# Patient Record
Sex: Male | Born: 1950 | Race: White | Hispanic: No | Marital: Married | Smoking: Never smoker
Health system: Southern US, Community
[De-identification: ages and names within clinical notes are randomized; demographics above are authoritative.]

## PROBLEM LIST (undated history)

## (undated) DIAGNOSIS — M1990 Unspecified osteoarthritis, unspecified site: Secondary | ICD-10-CM

## (undated) DIAGNOSIS — Z460 Encounter for fitting and adjustment of spectacles and contact lenses: Secondary | ICD-10-CM

## (undated) DIAGNOSIS — K5732 Diverticulitis of large intestine without perforation or abscess without bleeding: Secondary | ICD-10-CM

## (undated) DIAGNOSIS — F32A Depression, unspecified: Secondary | ICD-10-CM

## (undated) DIAGNOSIS — Z974 Presence of external hearing-aid: Secondary | ICD-10-CM

## (undated) DIAGNOSIS — C801 Malignant (primary) neoplasm, unspecified: Secondary | ICD-10-CM

## (undated) DIAGNOSIS — G4733 Obstructive sleep apnea (adult) (pediatric): Secondary | ICD-10-CM

## (undated) DIAGNOSIS — D126 Benign neoplasm of colon, unspecified: Secondary | ICD-10-CM

## (undated) DIAGNOSIS — E785 Hyperlipidemia, unspecified: Secondary | ICD-10-CM

## (undated) DIAGNOSIS — Z87442 Personal history of urinary calculi: Secondary | ICD-10-CM

## (undated) DIAGNOSIS — F329 Major depressive disorder, single episode, unspecified: Secondary | ICD-10-CM

## (undated) DIAGNOSIS — F419 Anxiety disorder, unspecified: Secondary | ICD-10-CM

## (undated) DIAGNOSIS — I499 Cardiac arrhythmia, unspecified: Secondary | ICD-10-CM

## (undated) HISTORY — DX: Obstructive sleep apnea (adult) (pediatric): G47.33

## (undated) HISTORY — DX: Diverticulitis of large intestine without perforation or abscess without bleeding: K57.32

## (undated) HISTORY — DX: Benign neoplasm of colon, unspecified: D12.6

## (undated) HISTORY — DX: Hyperlipidemia, unspecified: E78.5

## (undated) HISTORY — PX: COLONOSCOPY: SHX174

## (undated) HISTORY — DX: Depression, unspecified: F32.A

## (undated) HISTORY — DX: Major depressive disorder, single episode, unspecified: F32.9

## (undated) HISTORY — PX: LUMBAR LAMINECTOMY: SHX95

---

## 1986-02-02 DIAGNOSIS — M5030 Other cervical disc degeneration, unspecified cervical region: Secondary | ICD-10-CM | POA: Insufficient documentation

## 1990-01-04 HISTORY — PX: CERVICAL LAMINECTOMY: SHX94

## 1998-05-27 ENCOUNTER — Encounter: Payer: Self-pay | Admitting: Emergency Medicine

## 1998-05-27 ENCOUNTER — Emergency Department (HOSPITAL_COMMUNITY): Admission: EM | Admit: 1998-05-27 | Discharge: 1998-05-27 | Payer: Self-pay | Admitting: Emergency Medicine

## 1998-05-28 ENCOUNTER — Ambulatory Visit (HOSPITAL_COMMUNITY): Admission: RE | Admit: 1998-05-28 | Discharge: 1998-05-28 | Payer: Self-pay | Admitting: Cardiology

## 1998-05-28 ENCOUNTER — Encounter: Payer: Self-pay | Admitting: Cardiology

## 2002-05-22 ENCOUNTER — Encounter: Payer: Self-pay | Admitting: Neurosurgery

## 2002-05-22 ENCOUNTER — Ambulatory Visit (HOSPITAL_COMMUNITY): Admission: RE | Admit: 2002-05-22 | Discharge: 2002-05-22 | Payer: Self-pay

## 2003-11-10 ENCOUNTER — Encounter: Admission: RE | Admit: 2003-11-10 | Discharge: 2003-11-10 | Payer: Self-pay | Admitting: Orthopedic Surgery

## 2005-04-29 ENCOUNTER — Ambulatory Visit: Payer: Self-pay | Admitting: Gastroenterology

## 2005-05-07 ENCOUNTER — Ambulatory Visit: Payer: Self-pay | Admitting: Gastroenterology

## 2005-05-07 ENCOUNTER — Encounter (INDEPENDENT_AMBULATORY_CARE_PROVIDER_SITE_OTHER): Payer: Self-pay | Admitting: Specialist

## 2005-09-01 ENCOUNTER — Encounter: Admission: RE | Admit: 2005-09-01 | Discharge: 2005-09-01 | Payer: Self-pay | Admitting: Neurosurgery

## 2005-09-03 ENCOUNTER — Encounter: Admission: RE | Admit: 2005-09-03 | Discharge: 2005-09-03 | Payer: Self-pay | Admitting: Neurosurgery

## 2005-11-08 ENCOUNTER — Encounter: Admission: RE | Admit: 2005-11-08 | Discharge: 2005-11-08 | Payer: Self-pay | Admitting: Radiology

## 2006-01-04 HISTORY — PX: SHOULDER ARTHROSCOPY: SHX128

## 2006-01-04 HISTORY — PX: ROTATOR CUFF REPAIR: SHX139

## 2006-03-30 ENCOUNTER — Encounter: Admission: RE | Admit: 2006-03-30 | Discharge: 2006-03-30 | Payer: Self-pay | Admitting: Orthopedic Surgery

## 2006-04-14 ENCOUNTER — Ambulatory Visit (HOSPITAL_BASED_OUTPATIENT_CLINIC_OR_DEPARTMENT_OTHER): Admission: RE | Admit: 2006-04-14 | Discharge: 2006-04-15 | Payer: Self-pay | Admitting: Orthopedic Surgery

## 2006-04-14 ENCOUNTER — Encounter (INDEPENDENT_AMBULATORY_CARE_PROVIDER_SITE_OTHER): Payer: Self-pay | Admitting: Specialist

## 2006-05-19 ENCOUNTER — Encounter: Admission: RE | Admit: 2006-05-19 | Discharge: 2006-05-19 | Payer: Self-pay | Admitting: Orthopedic Surgery

## 2006-06-20 ENCOUNTER — Encounter: Admission: RE | Admit: 2006-06-20 | Discharge: 2006-06-20 | Payer: Self-pay | Admitting: Diagnostic Radiology

## 2006-12-08 ENCOUNTER — Ambulatory Visit (HOSPITAL_BASED_OUTPATIENT_CLINIC_OR_DEPARTMENT_OTHER): Admission: RE | Admit: 2006-12-08 | Discharge: 2006-12-08 | Payer: Self-pay | Admitting: Orthopedic Surgery

## 2007-08-27 IMAGING — CR DG SHOULDER 2+V*L*
3 series · 3 of 3 positions shown · non-contrast
Comparison: none

CLINICAL DATA: Left shoulder pain.
LEFT SHOULDER ? 3 VIEW:

[view not recorded (1 of 3)]
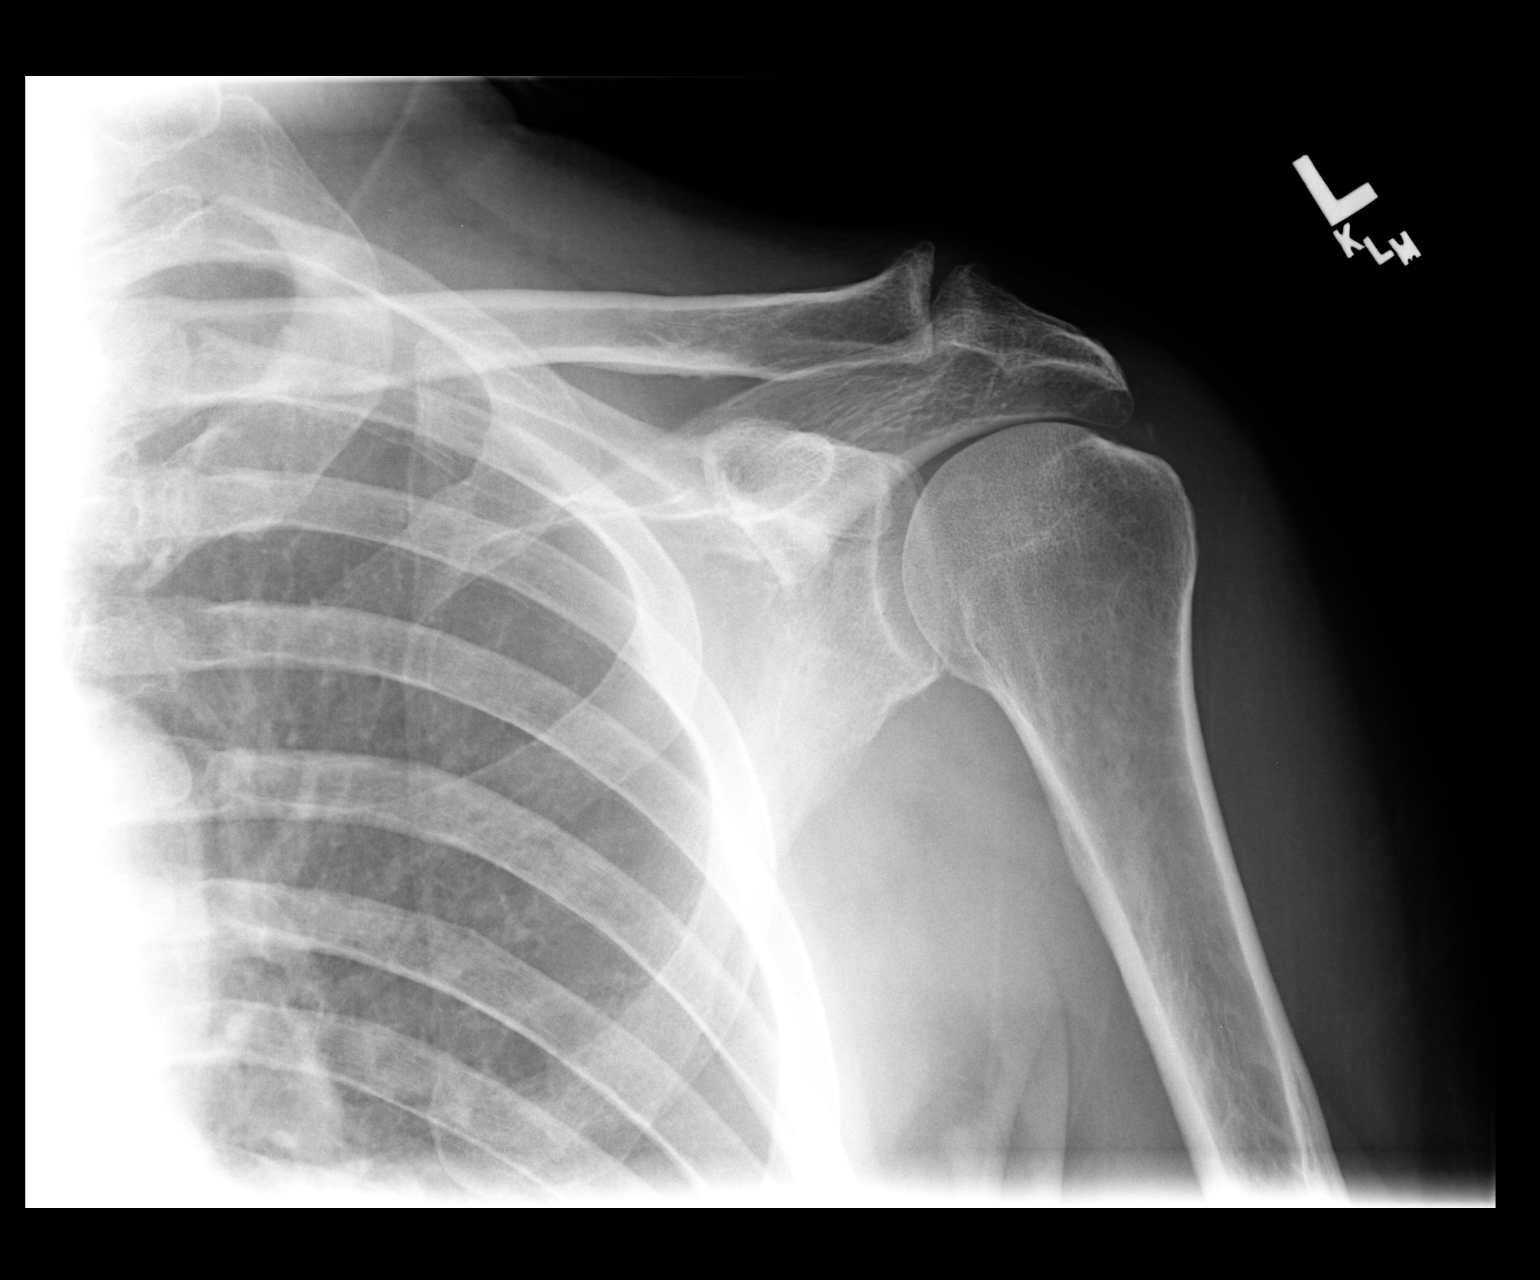

[view not recorded (2 of 3)]
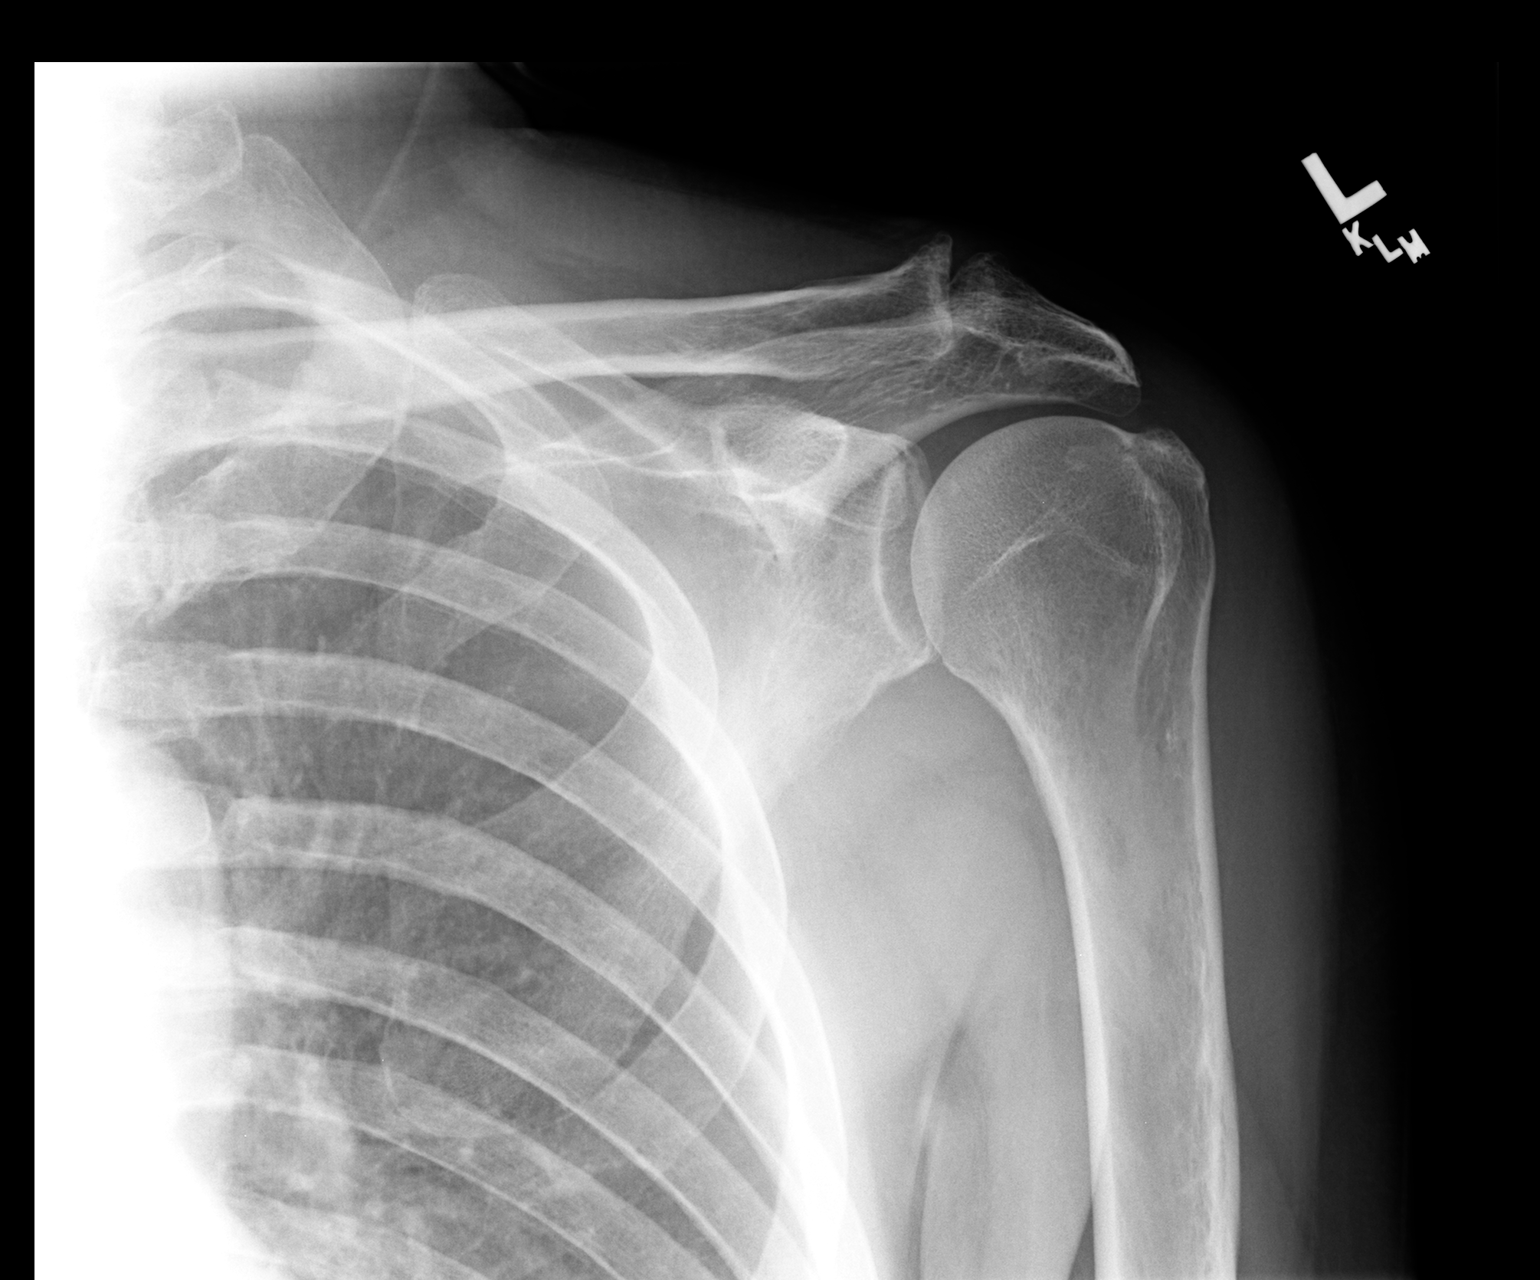

[view not recorded (3 of 3)]
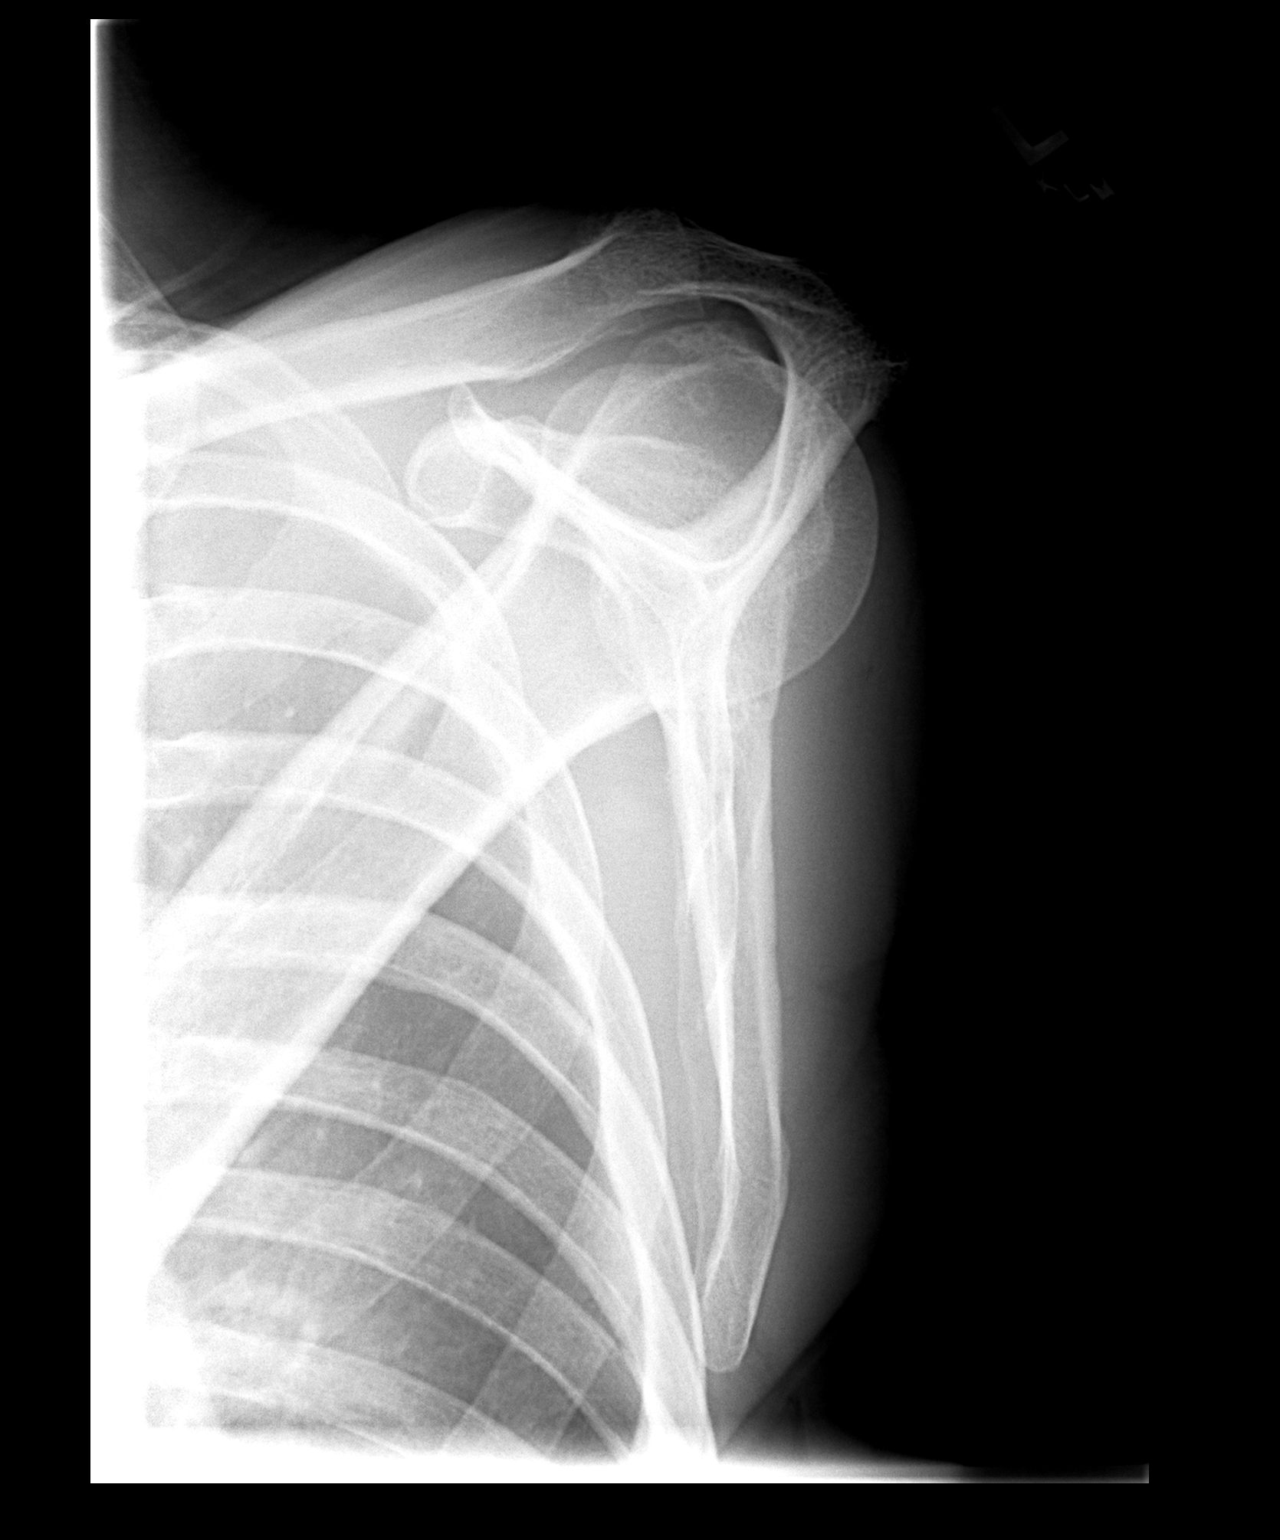

[3 of 3 positions shown; findings below may reference images not displayed]

FINDINGS: There is no evidence of acute fracture, subluxation, or dislocation.  Moderate acromioclavicular joint overgrowth is noted.  A tiny calcification in the region of the distal rotator cuff is present.
IMPRESSION: 1.  Moderate acromioclavicular degenerative changes. 
2.  Tiny density along the distal rotator cuff may represent rotator cuff calcification.

## 2007-09-05 ENCOUNTER — Encounter: Admission: RE | Admit: 2007-09-05 | Discharge: 2007-09-05 | Payer: Self-pay | Admitting: Orthopedic Surgery

## 2007-10-26 ENCOUNTER — Encounter (INDEPENDENT_AMBULATORY_CARE_PROVIDER_SITE_OTHER): Payer: Self-pay | Admitting: Orthopedic Surgery

## 2007-10-26 ENCOUNTER — Ambulatory Visit (HOSPITAL_BASED_OUTPATIENT_CLINIC_OR_DEPARTMENT_OTHER): Admission: RE | Admit: 2007-10-26 | Discharge: 2007-10-27 | Payer: Self-pay | Admitting: Orthopedic Surgery

## 2009-12-25 ENCOUNTER — Encounter
Admission: RE | Admit: 2009-12-25 | Discharge: 2009-12-25 | Payer: Self-pay | Source: Home / Self Care | Attending: Orthopaedic Surgery | Admitting: Orthopaedic Surgery

## 2010-01-04 HISTORY — PX: KNEE ARTHROSCOPY: SUR90

## 2010-01-15 ENCOUNTER — Ambulatory Visit
Admission: RE | Admit: 2010-01-15 | Discharge: 2010-01-15 | Payer: Self-pay | Source: Home / Self Care | Attending: Orthopaedic Surgery | Admitting: Orthopaedic Surgery

## 2010-01-19 LAB — POCT HEMOGLOBIN-HEMACUE: Hemoglobin: 15.8 g/dL (ref 13.0–17.0)

## 2010-01-25 ENCOUNTER — Encounter: Payer: Self-pay | Admitting: Orthopedic Surgery

## 2010-01-29 NOTE — Op Note (Addendum)
  NAMEISSAIH, KAUS NO.:  [id number]  MEDICAL RECORD NO.:  [id number]          PATIENT TYPE:  AMB  LOCATION:  DSC                          FACILITY:  [facility]  PHYSICIAN:  Lubertha Basque. Dalldorf, M.D.DATE OF BIRTH:  1950/05/31  DATE OF PROCEDURE:  01/15/2010 DATE OF DISCHARGE:                              OPERATIVE REPORT   PREOPERATIVE DIAGNOSIS:  Right knee torn medial meniscus.  POSTOPERATIVE DIAGNOSIS:  Right knee torn medial meniscus.  PROCEDURE:  Right knee partial medial meniscectomy.  ANESTHESIA:  Block and MAC.  ATTENDING SURGEON:  Lubertha Basque. Jerl Santos, MD  ASSISTANT:  Lindwood Qua, PA   INDICATIONS FOR PROCEDURE:  The patient is a 60 year old man with a several-month history of a locking painful knee.  This has persisted despite oral anti-inflammatories and bracing and rest.  By MRI scan, he has displaced meniscal tear.  He is offered an arthroscopy.  Informed operative consent was obtained after discussion of possible complications including reaction to anesthesia and infection.  SUMMARY OF FINDINGS AND PROCEDURE:  Under knee block and light MAC, a right knee arthroscopy was performed.  He did have a displaced meniscal tear with a large fragment in the notch consistent with his MRI.  A partial medial meniscectomy was done performing about 20% resection of this structure back to stable tissues.  He had really of paucity of degenerative changes in any compartment.  The lateral meniscus and ACL were intact.  DESCRIPTION OF THE PROCEDURE:  The patient was taken to the operating suite where knee block and MAC were applied.  He was positioned supine and prepped and draped in normal sterile fashion.  After administration of IV Kefzol, an arthroscopy of the right knee was performed through a total of 2 portals.  Findings were as noted above and procedure consisted of the partial medial meniscectomy done with basket and shaver back to stable tissues.   There were no degenerative changes to speak of. The patient watched the entire case.  The knee was thoroughly irrigated at the end of the case followed by placement of Adaptic over the portals, dry gauze, and loose Ace wrap.  Estimated blood loss and intraoperative fluids obtained from anesthesia records.  DISPOSITION:  The patient was taken to recovery room in stable condition.  He was to go home same day and follow up with me in my office next week.  I will contact him by phone tonight.     Lubertha Basque Jerl Santos, M.D.     PGD/MEDQ  D:  01/15/2010  T:  01/15/2010  Job:  [id number]  Electronically Signed by Marcene Corning M.D. on 01/29/2010 03:18:00 PM

## 2010-03-19 ENCOUNTER — Other Ambulatory Visit: Payer: Self-pay | Admitting: Dermatology

## 2010-04-15 ENCOUNTER — Other Ambulatory Visit: Payer: Self-pay | Admitting: Family Medicine

## 2010-04-16 ENCOUNTER — Ambulatory Visit
Admission: RE | Admit: 2010-04-16 | Discharge: 2010-04-16 | Disposition: A | Discharge status: Home / Self Care | Payer: PRIVATE HEALTH INSURANCE | Source: Ambulatory Visit | Attending: Family Medicine | Admitting: Family Medicine

## 2010-04-16 MED ORDER — IOHEXOL 300 MG/ML  SOLN: 100.0000 mL | Freq: Once | INTRAMUSCULAR | Status: AC | PRN

## 2010-05-19 NOTE — Op Note (Signed)
[name], [name]               ACCOUNT NO.:  [id number]   MEDICAL RECORD NO.:  [id number]          PATIENT TYPE:  AMB   LOCATION:  DSC                          FACILITY:  [facility]   PHYSICIAN:  Katy Fitch. Sypher, M.D. DATE OF BIRTH:  10-25-1950   DATE OF PROCEDURE:  12/08/2006  DATE OF DISCHARGE:                               OPERATIVE REPORT   PREOPERATIVE DIAGNOSIS:  Stage III impingement right shoulder with MRI  evidence of advanced AC arthropathy and unfavorable AC and acromial  anatomy with partial thickness bursal side degenerative tear of rotator  cuff.   POSTOPERATIVE DIAGNOSIS:  Stage III impingement right shoulder with  advanced AC arthropathy with meniscal debris at [facility] joint and significant  bursal side partial thickness abrasion tear of supraspinatus and  infraspinatus tendons.   OPERATION:  1. Examination of right shoulder under anesthesia.  2. Arthroscopic evaluation glenohumeral joint confirming a stable      biceps anchor and normal anterior glenohumeral ligaments and an      intact joint side surface of the rotator cuff tendon insertions.  3. Arthroscopic subacromial decompression with bursectomy,      coracoacromial ligament relaxation and acromioplasty.  4. Arthroscopic distal clavicle resection.   OPERATING SURGEON:  Katy Fitch. Sypher, M.D.   ASSISTANT:  Molly Maduro Dasnoit PA-C.   ANESTHESIA:  General endotracheal supplemented by a right interscalene  block.   SUPERVISING ANESTHESIOLOGIST:  Dr. Autumn Patty.   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  Early in  2008, we performed a reconstruction of a chronic stage III  impingement/degenerative rotator cuff tear predicament on the left.  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  Plain films of the right shoulder demonstrated very  unfavorable AC anatomy and signs  of chronic impingement.  An MRI  confirmed a partial-thickness rotator cuff tear and very unfavorable  distal clavicle and medial acromial anatomy.   After informed consent, he has elected to proceed with preemptive  arthroscopic surgery at this time anticipating debridement, subacromial  decompression, distal clavicle resection and debridement of the rotator  cuff abrasion tear.  After informed consent he is brought to the  operating room.   Preoperatively, we discussed the possibility of finding a full-thickness  rotator cuff tear that we would repair if identified.  If not, our goal  be to prevent continued stage II and III impingement and allow his  rotator cuff to repair if possible.   He understands that there is a risk that he will develop a rotator cuff  tear despite our decompression efforts due to intrinsic tendon aging and  apoptosis.   After informed consent, during which questions were invited and answered  in the office as well as holding area, he is brought to the operating  room at this time.   PROCEDURE:  [name] is brought to the operating room, placed in  supine position upon the operating table.   Dr. Sampson Goon had placed a right interscalene  block in the holding  area, leading to excellent anesthesia of the right forequarter.   He was brought to room #8, placed in supine position upon the operating  table and under Dr. Jarrett Ables direct supervision, general  endotracheal anesthesia induced.  He was carefully positioned in the  beach-chair position with the aid of a torso and head holder designed  for shoulder arthroscopy.   Examination of the right shoulder under anesthesia revealed no evidence  of instability of the glenohumeral joint in anterior, inferior and  posterior stress.  There was impingement at about 160 degrees of forward  elevation and abduction.   The right arm and forequarter were prepped with DuraPrep and draped with  impervious  arthroscopy drapes.   The procedure commenced with placement of the arthroscope through a  standard posterior portal.  Diagnostic arthroscopy revealed intact  hyaline articular cartilage surfaces on the humeral head and glenoid.  The labrum was intact.  The biceps origin was tested with a nerve hook  and found to be sound with no peel-back sign.  The deep surface of the  rotator cuff was intact including the subscapularis, supraspinatus,  infraspinatus and teres minor.  The inferior recess was normal.   The scope was removed from the glenohumeral joint after photographic  documentation of the joint condition.  The scope was then placed in the  subacromial space.  A remarkable amount of bursitis was noted which was  subsequently debrided with a suction shaver.  The [facility] joint had a very  thickened capsule.  This was taken down with the cutting cautery and  immediately a fragment of meniscus fell from the joint.  This was  documented with a digital camera.  The unfavorable AC anatomy was  documented with a camera followed by removal of the distal 18 mm of  clavicle with a suction bur brought in anteriorly.  The acromion was  leveled to a type 1 morphology with the anterior medial bone removal.  The coracoacromial ligament was relaxed with a cutting cautery and  hemostasis achieved with bipolar cautery.  The subacromial space was  debrided of hypertrophic bursa and bipolar cautery used to control  bleeding.  The conjoined tendon of the supraspinatus and infraspinatus  at the watershed area had a 40% or perhaps 50% partial thickness fray-  type tear.  There was no sign of a full-thickness tear.  This is similar  to the pathology noted on the left side, however, on the left side there  was about a 2 or more centimeter full-thickness gap created due to the  sub-AC abrasion.   The necrotic tendon was debrided.  In my judgment formal repair of the  rotator cuff is not indicated at this time.    The arthroscopic equipment was removed followed by repair of the portals  with mattress sutures of 3-0 Prolene and application of a luminous gauze  dressing with paper tape.   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  We will see him in 24 hours for dressing change, x-  ray and instruction in his initial rehabilitation program.  There were  no apparent complications.   For aftercare, he is provided prescriptions for Dilaudid 2 mg one or two  tablets p.o. q.4-6h. p.r.n. pain 40 tablets without refill.  Also,  Keflex 500 mg one p.o. q.8h. x4 days as a prophylactic antibiotic and he  has ibuprofen at his home.  Katy Fitch Sypher, M.D.  Electronically Signed     RVS/MEDQ  D:  12/08/2006  T:  12/08/2006  Job:  [id number]   cc:   Geanie Cooley, M.D.

## 2010-05-19 NOTE — Op Note (Signed)
NAMEKATRELL, MILHORN               ACCOUNT NO.:  [id number]   MEDICAL RECORD NO.:  [id number]          PATIENT TYPE:  AMB   LOCATION:  DSC                          FACILITY:  [facility]   PHYSICIAN:  Katy Fitch. Sypher, M.D. DATE OF BIRTH:  1950/05/10   DATE OF PROCEDURE:  10/26/2007  DATE OF DISCHARGE:                               OPERATIVE REPORT   PREOPERATIVE DIAGNOSES:  Complex mid substance tear, left infraspinatus  tendon, medial to insertion at greater tuberosity, left shoulder with  partial posterior tear of supraspinatus, and significant degenerative  tendinopathy and boutonniere of shoulder.   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   OPERATION:  1. Arthroscopic tenolysis of supraspinatus, infraspinatus, and teres      minor tendons with extensive release of adhesions from capsule of      acromioclavicular joint deltoid and deep surface of acromion.  2. Arthroscopic and open anterior interval slide for a severely      retracted U-shape longitudinal tear of the infraspinatus and teres      minor tendons with superior migration of the humeral head through a      boutonniere deformity created by the marked retraction of the      infraspinatus tendon.  3. Open reconstruction of left rotator cuff utilizing a fiber tape,      medial convergent suture and multiple Mason-Allen figure-of-eight      sutures brought through drill holes to bone laterally for      lateralization of supraspinatus and infraspinatus tendons.   OPERATING SURGEON:  Katy Fitch. Sypher, MD   ASSISTANT:  Marveen Reeks Dasnoit, PA-C   ANESTHESIA:  General endotracheal supplemented by left interscalene  block.   SUPERVISING ANESTHESIOLOGIST:  Janetta Hora. Gelene Mink, MD   INDICATIONS:  Allayne Gitelman. 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  He has a history of bilateral  significant shoulder impingement and a necrotic complex tear of his left  supraspinatus identified in March 2008, repaired in April 2008 with a  partial superior transfer of the subscapularis and convergence with the  infraspinatus to decorticated greater tuberosity with a double row  repair utilizing McLaughlin through bone sutures and over-the-top  sutures of the medial row to a lateral PushLock.   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   Due to his subscapularis transfer, he had some residual loss of  elevation and full external rotation and 90 degrees abduction.   Approximately 1 year following surgery, he noted a sense of slipping in  his shoulder and began to have impaired shoulder rhythm.  He was using  primarily scapulothoracic abduction rather than a combined glenohumeral  and scapulothoracic abduction of the left shoulder.   Clinical examination suggested impairment of the rotator cuff.  An MRI  of the rotator cuff was repeated  in September 2009, documenting a mid  substance rupture of the infraspinatus.   Referring back to the March 2008 MRI suggested that there was  significant native tendinosis/tendinopathy of the infraspinatus.  At  that time, however, with direct visual inspection at surgery, he  appeared to have adequate tendon substance to allow repair.   It is now apparent that he has a complex tendinopathy that will be quite  challenging to reconstruct.   With preoperative informed consent, we advised him that we would attempt  an anterior interval slide, margin convergence, and reconstruction of  his infraspinatus, teres minor, and supraspinatus to the greater  tuberosity.   We had entertained the  possible use of the tendon substitute such as  graft jacket or his native tendon from a remote location.   In general, these type of procedures have uneven outcomes; therefore, a  primary repair is deemed preferable.   Preoperatively, he was advised of the potential risks and benefits of  surgery.  He is anticipating a salvage intervention for his rotator cuff  tendinopathy.   Preoperatively, he was interviewed by Dr. Gelene Mink who recommended  proceeding with a left interscalene block.  This was placed without  complication in the holding area, followed by transfer to room 8.  Under  Dr. Thornton Dales direct supervision, general endotracheal anesthesia was  induced followed by careful positioning in the beach chair position with  aid of a torso and head holder designed for shoulder arthroscopy.   Examination of the left shoulder under anesthesia revealed combined  elevation of 160 degrees, external rotation of 65 degrees, and 90  degrees of abduction, and internal rotation of 30 degrees.  There was a  sense of posterior superior migration with pistoning of the shoulder and  what appeared to be some buttonholing and tension of the coracohumeral  ligament limiting the external rotation.   We proceeded to examine the shoulder with the arthroscope, placed in a  standard posterior viewing portal, using a switching stick from an  anterior approach, so as not to accidentally impale the rotator cuff.   Immediately upon joint entry, it was obvious that there was a complex  necrotic rupture of the entire infraspinatus and a portion of the  supraspinatus tendon that had been repaired and transferred with the  subscapularis in April 2008.  Photographic documentation of the tendon  rupture was accomplished with a digital camera.  It should be noted that  there were loops of suture that formerly anchored the infraspinatus and  supraspinatus hanging intact within the joint suggesting that the   failure of the repair had been at the suture tendon interface.   The biceps tendon was medially subluxed slightly and there was some  labral degeneration.  An anterior portal was created under direct vision  followed by labral debridement and extensive debridement of the necrotic  tendon.   Following the principles of Burkhardt, a complete tenolysis of the cuff  was accomplished with release of adhesions in the superior recess,  release of adhesions around the biceps tendon, debridement of the  supraspinatus, infraspinatus, and teres minor to a stable margin  followed by placement of the scope in the subacromial space.  Extensive  adhesions were noted between the transferred subscapularis and  supraspinatus with the acromion and the deltoid fascia.  The [facility] joint  resection site also had dense adhesions to the supraspinatus, which were  lysed with a suction shaver and electrocautery.  An arthroscopic  anterior interval slide was  initiated, however, due to the marked  adhesions and the density of the adhesions, we ultimately proceeded with  skeletization of the supraspinatus and infraspinatus on their bursal  surfaces and skeletization of the scapular spine to relieve all  adhesions.  Care was taken to identify the location of the suprascapular  nerve and not to penetrate the fat pad adjacent to the scapular spine.  We used an arthroscopic tendon grasper to try to mobilize the  supraspinatus and infraspinatus and found them to be densely adhesed  with the teres minor and infraspinatus quite adhesed well posterior.  At  this point, I elected to proceed with an open tenolysis and  reconstruction.  The arthroscopic equipment was removed followed by  reentry through Dr. Luvenia Starch prior incision from April 2008.  The scar  was resected followed by anterior deltoid split, elevation of the  anterior third of deltoid about 1 cm at the anterior lateral corner of  the acromion, and elevation of the  middle third approximately 1 cm  posteriorly.  There was a reactive bone spur that had formed at the  anterior acromion that was meticulously resected with a hand rasp and  rongeur.  An extensive bursectomy was accomplished.  There were dense  bursal adhesions between the acromion, AC joint capsule, and the  supraspinatus as well as extremely dense adhesions posteriorly with a  retracted infraspinatus and teres minor.  After extensive bursectomy,  well posterior, I was able to place a series of traction sutures from  lateral to medial in the teres minor, infraspinatus, and gradually moved  the tendon anteriorly and forward.  A pair of scissors were used to  release the rotator interval followed by release of the coracohumeral  ligament at the base of the coracoid followed by tenolysis of the  rotator cuff with a Key elevator, specifically the supraspinatus, and a  posterior shift of the supraspinatus approximately 1-1.5 cm and  lateralization of approximately another 1 cm.  With great effort, we  gradually lysed the adhesions on the infraspinatus and teres minor and  after placing a fiber tape with the Scorpion arthroscopic suture passer  well medially.  We were able to place a figure-of-eight suture creating  a margin convergence between the supraspinatus and the infraspinatus.  After further mobilization of the tendons, I was able to lateralize the  infraspinatus and supraspinatus to a near anatomic footprint followed by  placement of 2 figure-of-eight mattress sutures of #2 FiberWire placed  through drill holes in bone with a Mason-Allen technique.   About a 95% coverage of the greater tuberosity was achieved with a near  anatomic position of the infraspinatus and supraspinatus junction.   Due to the nature of the tear, a fragment of the infraspinatus on the  greater tuberosity measuring about 1.5 cm in length and 1 cm in width  was removed in preparation of the greater tuberosity and  placed in  formalin for pathologic evaluation.   I contacted Dr. Tammi Sou of Surgical Pathology, and asked him to  specifically exam the tendon looking for signs of tenocyte viability and  to offer an opinion regarding the vascular nature of the tendon.   This tendon fragment on the greater tuberosity should have a decent  blood supply.  Our concern is that the watershed area of the tendon is  where the problem lies.   Due to our challenges in repairing the tendon, I did not biopsy any  tendon on the watershed side as  we had precious little tendon to work  with.   A tenuous repair was achieved with good margin convergence and coverage  of the humeral head.   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   Our estimated blood loss was 200 mL.  No replacement was provided.  There were no apparent complications.   Dr. Luvenia Starch wounds were thoroughly irrigated with the arthroscopic  pump and the scope was replaced in the joint after completion of the  repair.  Photographic documentation of the margin convergence was  accomplished as well as inset of the tendon to the margin of the  articular surface.   Dr. Luvenia Starch wounds were repaired with corner suture of #2 FiberWire,  repairing the deltoid split, and release of the acromion, followed by  repair of the lateral deltoid split with simple suture of 0 Vicryl,  repair of the subcutaneous tissues with interrupted suture of 0 and 2-0  Vicryl, and repair of the skin with intradermal through a Prolene and  Steri-Strips.  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  He was placed in a sling and transferred to  recovery room with stable vital signs.       Katy Fitch Sypher, M.D.   Electronically Signed     RVS/MEDQ  D:  10/26/2007  T:  10/27/2007  Job:  [id number]   cc:   L. Lupe Carney, M.D.

## 2010-05-22 NOTE — Op Note (Signed)
[name], [name]               ACCOUNT NO.:  [id number]   MEDICAL RECORD NO.:  [id number]          PATIENT TYPE:  AMB   LOCATION:  DSC                          FACILITY:  [facility]   PHYSICIAN:  [name], M.D. 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   POSTOPERATIVE DIAGNOSIS:  Complex mid substance degenerative tear of  left supraspinatus rotator cuff tendon with chronic stage III  impingement due to severe AC arthropathy and unfavorable acromial  anatomy with confirmation of severe AC arthropathy with chronic  degenerative fragmented intrasubstance tearing of entire supraspinatus  tendon measuring approximately 4 cm from lateral to medial and  approximately 3.5 cm from anterior to posterior.   OPERATION:  1. Diagnostic arthroscopy left glenohumeral joint.  2. Arthroscopic subacromial decompression with bursectomy and      resection of capsule of AC joint followed by open subacromial      decompression with acromioplasty, coracoacromial ligament      relaxation, and anterior acromioplasty.  3. Open distal clavicle resection.  4. Reconstruction of degenerative delaminated supraspinatus rotator      cuff tear utilizing a superior subscapularis and rotator interval      transfer.   OPERATIONS:  [name], M.D.   ASSISTANT:  Tyrone Maduro Dasnoit PA-C.   ANESTHESIA:  Is general endotracheal supplemented by interscalene block,  supervising anesthesiologist [name].   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   He had been experiencing night pain, pain of abduction, weakness of  abduction and progressive loss of motion on the left.  In November 2007  he had a steroid injection provided by radiology colleague and had  transient relief.  He subsequently presented for evaluation of shoulder  due to progressive night pain, weakness of abduction and crepitation  beneath the [employer] joint.   Clinical examination the office revealed marked impingement, weakness of  abduction, weakness of external rotation.   An MRI was obtained which documented very unfavorable AC anatomy. A  delaminated degenerative tear of the supraspinatus tendon was retracted  approximately 2 cm medially and unfavorable anterior acromial anatomy.   We advised [name] to proceed directly to diagnostic arthroscopy  anticipating subacromial decompression, distal clavicle resection, and  reconstruction of the supraspinatus.   Given the predicament of a mid substance tear this could be very  challenging to reconstruct.   Preoperatively we discussed the possible use of a subscapularis transfer  to augment the severely degenerative supraspinatus tendon.   Preoperatively we advised that we would be able to provide a detailed  aftercare plan once we had a complete appraisal of the muscle and tendon  predicament involving the left  rotator cuff.   After informed consent, [name] is brought to the operating at this  time.   PROCEDURE:  [name] was brought to operating room and placed in  supine position on the table.   [name] provided an anesthesia consult in the holding area  preoperatively and recommended proceeding with a left interscalene  block.   The block was placed in holding area without complication.  [name]  had excellent anesthesia of his left arm and forequarter.   [name] was transferred to room 2, placed in supine position on the  table and under Dr.  Burnett [name] strict supervision placed under general  endotracheal anesthesia. He was carefully positioned in the beach-chair  position with aid of torso and head holder designed for shoulder  arthroscopy.  Care was taken to avoid neck flexion due to a history of  degenerative disk disease.   The entire left arm and forequarter prepped with DuraPrep and draped  with impervious arthroscopy drapes.  Examination of the shoulder under  anesthesia revealed a minimal adhesive capsulitis with inability to  extend the shoulder to the plane of the scapulae.  Combined elevation  was limited at 165 degrees and external rotation limited at 80 degrees  and internal rotation limited at 60 degrees.   The arthroscope was introduced through a standard posterior viewing  portal through the soft spot posteriorly with a blunt trocar.  Diagnostic arthroscopy confirmed intact hyaline articular cartilage  surfaces of the glenoid and humeral head.  The capsule ligamentous  structures were intact.  There was moderate synovitis anteriorly  adjacent to the insertion of the subscapularis.  There was a very frayed  degenerative tear of the supraspinatus, rendering it challenging to  visualize the insertion at the greater tuberosity.  There was some  undermining in a bare area deep to the infraspinatus and teres minor  which is a normal variant.  The infraspinatus appeared to partially  degenerative on its anterior border.  The teres minor appeared to be  normal.  The subscapularis insertion appeared to be sound.  The long  head of biceps was stable at the origin on the superior glenoid and  normal through the rotator interval.   The arthroscope was then removed from the glenohumeral joint and placed  in subacromial space.  A florid bursitis was identified as well as very  unfavorable AC anatomy.  The inferior distal clavicle projected within the subacromial space  nearly 1 cm with a very large posterior osteophyte.   The capsule was  intact but scarred.  There was very unfavorable anterior anatomy of the  coracoacromial ligament.  This was documented with digital camera  followed by use of a suction shaver brought in laterally to debride  bursa and the capsule of AC joint.   Given the very hypertrophic degenerative change of the distal clavicle I  ultimately elected not to proceed with arthroscopic resection of  clavicle and due to the severe degenerative changes of the  supraspinatus, rendering lateral portal access to the subacromial space  challenging, I elected to proceed directly to open reconstruction of the  cuff.   A 7 cm incision was fashioned across the anterior acromion beginning at  the [facility] joint.  The capsule of the [facility] joint was meticulously elevated off  the clavicle and medial acromion followed by leveling of the medial  acromial osteophyte with a rongeur.  The distal 18 mm clavicle was  identified and baby Bennett retractors placed.  The distal clavicle was  removed with an oscillating saw.  The inferior clavicle was palpated to  be certain that no residual osteophytes remained.  The coracoacromial  ligaments released with the tenotomy scissors and electrocauterized to  prevent bleeding from the acromial branch.  The bursa released and the  complex rotator cuff predicament identified.  There was a profound  delaminated shredded appearance of the supraspinatus over a distance of  3.5 cm from anterior to posterior and 4 cm from lateral to medial. The  consistency of the tendon was calcified, avascular, and essentially with  the integrity of feta cheese.   Extensive debridement of all of the necrotic tendon was accomplished  followed by leveling of the acromion to a type 1 morphology with the  oscillating saw and hand rasp followed by trimming the medial acromion  with a rongeur to remove all AC osteophytes.  After complete resection  of all necrotic tendon we found a rather unusual  predicament of a thin  film of part of the supraspinatus remaining measuring about 1 mm in  thickness and a very large defect of the entire tendon. Given this  predicament I proceeded with a subscapularis and rotator interval  posterior lateral transfer.  The long head biceps was identified with  great care a scalpel was used to elevate the capsule and rotator  interval down to the superior 15% of the subscapularis.  A tenotomy  scissors were used to split the tendon of this subscapularis, pass the  glenoid anteriorly and a flap was raised which included the rotator  interval, the coracohumeral ligament and the upper subscapularis.  This  was placed under traction with a #2 FiberWire grasping suture and  ultimately was found to have adequate excursion to allow a near normal  external rotation of the shoulder.  This was transferred superiorly and laterally filling the defect in the  supraspinatus.   I subsequently decorticated the entire greater tuberosity at the  insertion of the supraspinatus, placed two medial biocorkscrew anchors,  one at the junction of the infraspinatus and supraspinatus and one at  the center of the supraspinatus at the medial footprint of the  tuberosity followed by placement of two McLaughlin through bone sutures.  The anterior suture transferred the subscapularis superiorly and  laterally and the second gathered the interval between the subscapularis  and the infraspinatus.  The tendon margin was inset with a series of  figure-of-eight 0 Vicryl sutures followed by tensioning of the mattress  sutures from the biocorkscrews and use of the anterior biocorkscrew  sutures to create a over-the-top delay suture utilizing a push lock  laterally just posterior to the biceps tendon.  A very satisfactory  construct was achieved with essentially recreation of a full-thickness  of the supraspinatus.   This essentially a vascularized tendon graft.  We will allow the  rotator  interval and subscapularis to heal by secondary intention.   This technique may lead to some loss of external rotation and perhaps  some loss of full elevation, however, with past experience we have been  able to recover near normal motion with a vigorous therapy once the  tendon has healed.   The wound was power irrigated with the arthroscopic cannula followed by  repair of the deltoid to the posterior trapezius muscle meticulously  with mattress sutures of #2 FiberWire followed by repair of the anterior  third of deltoid to the periosteum and acromion with mattress sutures of  #2 FiberWire and a  running suture of 0 Vicryl.  The deltoid muscle split  was closed with a figure-of-eight suture of 0 Vicryl.   There were no apparent complications except for a single glove  penetration injury during closure.  Our gloves were changed immediately  and the suture was passed off.   [name] will be covered with Ancef 1 gram IV p.o. q.8 hours x24  hours as prophylactic antibiotic and we will add doxycycline 100 mg p.o.  q.12 hours x5 days as a prophylactic antibiotic.   The wound was once again meticulously irrigated and the skin repaired  with subdermal sutures of 2-0 Vicryl and segmental intradermal 3-0  Prolene.   The portals repaired with 3-0 Prolene.   [name] was placed in compressive dressing and sling, awakened from  general anesthesia and transferred to recovery room with stable vital  signs.      Tyrone Fitch [name], M.D.  Electronically Signed     RVS/MEDQ  D:  04/14/2006  T:  04/14/2006  Job:  [id number]   cc:   L. Lupe Carney, M.D.  [name], dept. of radiology

## 2010-06-05 DIAGNOSIS — K5732 Diverticulitis of large intestine without perforation or abscess without bleeding: Secondary | ICD-10-CM

## 2010-06-05 HISTORY — DX: Diverticulitis of large intestine without perforation or abscess without bleeding: K57.32

## 2010-06-09 ENCOUNTER — Encounter: Payer: Self-pay | Admitting: Gastroenterology

## 2010-06-25 ENCOUNTER — Encounter: Payer: Self-pay | Admitting: Gastroenterology

## 2010-08-03 ENCOUNTER — Ambulatory Visit (AMBULATORY_SURGERY_CENTER): Payer: PRIVATE HEALTH INSURANCE | Admitting: *Deleted

## 2010-08-03 VITALS — Ht 71.0 in | Wt 210.0 lb

## 2010-08-03 DIAGNOSIS — Z1211 Encounter for screening for malignant neoplasm of colon: Secondary | ICD-10-CM

## 2010-08-03 MED ORDER — PEG-KCL-NACL-NASULF-NA ASC-C 100 G PO SOLR: ORAL | Status: DC

## 2010-08-24 ENCOUNTER — Other Ambulatory Visit: Payer: PRIVATE HEALTH INSURANCE | Admitting: Gastroenterology

## 2010-09-14 ENCOUNTER — Encounter: Payer: Self-pay | Admitting: Gastroenterology

## 2010-09-14 ENCOUNTER — Other Ambulatory Visit: Payer: Self-pay | Admitting: *Deleted

## 2010-09-14 ENCOUNTER — Ambulatory Visit (AMBULATORY_SURGERY_CENTER): Payer: PRIVATE HEALTH INSURANCE | Admitting: Gastroenterology

## 2010-09-14 VITALS — BP 111/64 | HR 67 | Temp 97.5°F | Resp 16 | Ht 71.0 in | Wt 210.0 lb

## 2010-09-14 DIAGNOSIS — Z800 Family history of malignant neoplasm of digestive organs: Secondary | ICD-10-CM

## 2010-09-14 DIAGNOSIS — K5730 Diverticulosis of large intestine without perforation or abscess without bleeding: Secondary | ICD-10-CM | POA: Insufficient documentation

## 2010-09-14 DIAGNOSIS — Z1211 Encounter for screening for malignant neoplasm of colon: Secondary | ICD-10-CM

## 2010-09-14 MED ORDER — SODIUM CHLORIDE 0.9 % IV SOLN: 500.0000 mL | INTRAVENOUS | Status: DC

## 2010-09-14 MED ORDER — HYOSCYAMINE SULFATE 0.125 MG SL SUBL: SUBLINGUAL_TABLET | SUBLINGUAL | Status: DC

## 2010-09-14 NOTE — Patient Instructions (Signed)
Discharge instructions given with verbal understanding. Handouts on diverticulosis and a high fiber diet. Resume previous medications.

## 2010-09-15 ENCOUNTER — Telehealth: Payer: Self-pay | Admitting: *Deleted

## 2010-09-15 NOTE — Telephone Encounter (Signed)

## 2010-10-05 LAB — POCT HEMOGLOBIN-HEMACUE: Hemoglobin: 16.3

## 2012-01-04 ENCOUNTER — Other Ambulatory Visit: Payer: Self-pay | Admitting: Dermatology

## 2012-11-06 ENCOUNTER — Ambulatory Visit (INDEPENDENT_AMBULATORY_CARE_PROVIDER_SITE_OTHER): Payer: Self-pay | Admitting: General Surgery

## 2012-11-08 ENCOUNTER — Encounter (INDEPENDENT_AMBULATORY_CARE_PROVIDER_SITE_OTHER): Payer: Self-pay | Admitting: General Surgery

## 2012-11-20 ENCOUNTER — Encounter (INDEPENDENT_AMBULATORY_CARE_PROVIDER_SITE_OTHER): Payer: Self-pay | Admitting: Surgery

## 2012-11-20 ENCOUNTER — Ambulatory Visit (INDEPENDENT_AMBULATORY_CARE_PROVIDER_SITE_OTHER): Payer: PRIVATE HEALTH INSURANCE | Admitting: Surgery

## 2012-11-20 VITALS — BP 112/62 | HR 68 | Temp 97.8°F | Resp 14 | Ht 70.0 in | Wt 209.6 lb

## 2012-11-20 DIAGNOSIS — D1721 Benign lipomatous neoplasm of skin and subcutaneous tissue of right arm: Secondary | ICD-10-CM | POA: Insufficient documentation

## 2012-11-20 DIAGNOSIS — R220 Localized swelling, mass and lump, head: Secondary | ICD-10-CM | POA: Insufficient documentation

## 2012-11-20 DIAGNOSIS — D1722 Benign lipomatous neoplasm of skin and subcutaneous tissue of left arm: Secondary | ICD-10-CM

## 2012-11-20 DIAGNOSIS — D1739 Benign lipomatous neoplasm of skin and subcutaneous tissue of other sites: Secondary | ICD-10-CM

## 2012-11-20 DIAGNOSIS — R229 Localized swelling, mass and lump, unspecified: Secondary | ICD-10-CM

## 2012-11-20 NOTE — Progress Notes (Signed)
Subjective:     Patient ID: [name], male   DOB: Dec 28, 1950, 62 y.o.   MRN: [id number]  HPI  [name]  1950/11/13 [id number]  Patient Care Team: Lupe Carney, MD as PCP - General (Family Medicine) Eloise Harman as Consulting Physician (Family Medicine)  This patient is a 62 y.o.male who presents today for surgical evaluation at the request of Dr. Jena Gauss.   Reason for visit: Enlarging mass on scalp.  Pleasant radiologist.  History of subcutaneous masses all of his life.  He is pretty sure they are lipomas.  In the arms.  They do not bother him.  However, he has noticed a mass on his scalp.  It has gotten larger over the past 8 months.  Bothers him when he tries to lie down.  Sometimes sore.  No ulceration or bleeding.  A concern to him.  He wished to consider having it removed.  Weight is stable.  No fevers chills or sweats.  No history of fall or trauma.  He does not smoke.  Rather physically active  Patient Active Problem List   Diagnosis Date Noted  . Special screening for malignant neoplasms, colon 09/14/2010  . Diverticulosis of colon (without mention of hemorrhage) 09/14/2010    Past Medical History  Diagnosis Date  . Depression   . Hyperlipidemia   . Adenomatous polyp of colon   . Diverticulitis of colon 06/2010    recurrent    Past Surgical History  Procedure Laterality Date  . Colonoscopy    . Cervical laminectomy  1992  . Lumbar laminectomy  1974 &1977  . Knee arthroscopy      bilateral  . Shoulder arthroscopy      right  . Rotator cuff repair      left    History   Social History  . Marital Status: Married    Spouse Name: N/A    Number of Children: N/A  . Years of Education: N/A   Occupational History  . Not on file.   Social History Main Topics  . Smoking status: Never Smoker   . Smokeless tobacco: Never Used  . Alcohol Use: 4.2 oz/week    7 Glasses of wine per week  . Drug Use: No  . Sexual Activity: Not on file   Other  Topics Concern  . Not on file   Social History Narrative  . No narrative on file    Family History  Problem Relation Age of Onset  . Colon cancer Father   . Colon cancer Maternal Grandmother     Current Outpatient Prescriptions  Medication Sig Dispense Refill  . aspirin 81 MG tablet Take 81 mg by mouth daily.        Marland Kitchen buPROPion (WELLBUTRIN XL) 150 MG 24 hr tablet Take 150 mg by mouth daily.      Marland Kitchen venlafaxine (EFFEXOR) 75 MG tablet Take 75 mg by mouth 2 (two) times daily.       Current Facility-Administered Medications  Medication Dose Route Frequency Provider Last Rate Last Dose  . 0.9 %  sodium chloride infusion  500 mL Intravenous Continuous Mardella Layman, MD         No Known Allergies  BP 112/62  Pulse 68  Temp(Src) 97.8 F (36.6 C) (Temporal)  Resp 14  Ht 5\' 10"  (1.778 m)  Wt 209 lb 9.6 oz (95.074 kg)  BMI 30.07 kg/m2  No results found.   Review of Systems  Constitutional: Negative for fever,  chills, diaphoresis, activity change, appetite change, fatigue and unexpected weight change.  HENT: Negative for ear discharge, facial swelling, mouth sores, nosebleeds, sore throat and trouble swallowing.   Eyes: Negative for photophobia, discharge and visual disturbance.  Respiratory: Negative for choking, chest tightness, shortness of breath and stridor.   Cardiovascular: Negative for chest pain and palpitations.  Gastrointestinal: Negative for nausea, vomiting, abdominal pain, diarrhea, constipation, blood in stool, abdominal distention, anal bleeding and rectal pain.  Endocrine: Negative for cold intolerance and heat intolerance.  Genitourinary: Negative for dysuria, urgency, difficulty urinating and testicular pain.  Musculoskeletal: Negative for arthralgias, back pain, gait problem and myalgias.  Skin: Negative for color change, pallor, rash and wound.  Allergic/Immunologic: Negative for environmental allergies and food allergies.  Neurological: Negative for  dizziness, speech difficulty, weakness, numbness and headaches.  Hematological: Negative for adenopathy. Does not bruise/bleed easily.  Psychiatric/Behavioral: Negative for hallucinations, confusion and agitation.       Objective:   Physical Exam  Constitutional: He is oriented to person, place, and time. He appears well-developed and well-nourished. No distress.  HENT:  Head: Normocephalic.    Mouth/Throat: Oropharynx is clear and moist. No oropharyngeal exudate.  Eyes: Conjunctivae and EOM are normal. Pupils are equal, round, and reactive to light. No scleral icterus.  Neck: Normal range of motion. Neck supple. No tracheal deviation present.  Cardiovascular: Normal rate, regular rhythm and intact distal pulses.   Pulmonary/Chest: Effort normal and breath sounds normal. No respiratory distress.  Abdominal: Soft. He exhibits no distension. There is no tenderness. Hernia confirmed negative in the right inguinal area and confirmed negative in the left inguinal area.  Musculoskeletal: Normal range of motion. He exhibits no tenderness.       Arms: Lymphadenopathy:    He has no cervical adenopathy.       Right: No inguinal adenopathy present.       Left: No inguinal adenopathy present.  Neurological: He is alert and oriented to person, place, and time. No cranial nerve deficit. He exhibits normal muscle tone. Coordination normal.  Skin: Skin is warm and dry. No rash noted. He is not diaphoretic. No erythema. No pallor.  Psychiatric: He has a normal mood and affect. His behavior is normal. Judgment and thought content normal.       Assessment:     Enlarging mass in subcutaneous tissues the posterior scalp.  Lipoma vs. Cyst.  Stable subcutaneous masses of arms.  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     Plan:     Because it hass gotten larger and is becoming symptomatic, I recommended removal.  Given its location of the scalp, I would like to do an operating room under better hemostatic control.   I discussed with Dr. Jena Gauss:  The pathophysiology of skin & subcutaneous masses was discussed.  Natural history risks without surgery were discussed.  I recommended surgery to remove the mass.  I explained the technique of removal with use of local anesthesia & possible need for more aggressive sedation/anesthesia for patient comfort.    Risks such as bleeding, infection, heart attack, death, and other risks were discussed.  I noted a good likelihood this will help address the problem.   Possibility that this will not correct all symptoms was explained. Possibility of regrowth/recurrence of the mass was discussed.  We will work to minimize complications. Questions were answered.  The patient expresses understanding & wishes to proceed with surgery.  The arm subcutaneous masses are stable.  They are not bothering him.  He  did not wish to have those removed.  I think that is fine as long as he continues to observe them.

## 2012-11-20 NOTE — Patient Instructions (Signed)
See the Handout(s) we gave you.  Consider surgery to remove mass in scalp.  Please call our office at [phone] if you wish to schedule surgery or if you have further questions / concerns.   Lipoma A lipoma is a noncancerous (benign) tumor composed of fat cells. They are usually found under the skin (subcutaneous). A lipoma may occur in any tissue of the body that contains fat. Common areas for lipomas to appear include the back, shoulders, buttocks, and thighs. Lipomas are a very common soft tissue growth. They are soft and grow slowly. Most problems caused by a lipoma depend on where it is growing. DIAGNOSIS  A lipoma can be diagnosed with a physical exam. These tumors rarely become cancerous, but radiographic studies can help determine this for certain. Studies used may include:  Computerized X-ray scans (CT or CAT scan).  Computerized magnetic scans (MRI). TREATMENT  Small lipomas that are not causing problems may be watched. If a lipoma continues to enlarge or causes problems, removal is often the best treatment. Lipomas can also be removed to improve appearance. Surgery is done to remove the fatty cells and the surrounding capsule. Most often, this is done with medicine that numbs the area (local anesthetic). The removed tissue is examined under a microscope to make sure it is not cancerous. Keep all follow-up appointments with your caregiver. SEEK MEDICAL CARE IF:   The lipoma becomes larger or hard.  The lipoma becomes painful, red, or increasingly swollen. These could be signs of infection or a more serious condition. Document Released: 12/11/2001 Document Revised: 03/15/2011 Document Reviewed: 05/23/2009 [facility] Patient Information 2014 [place], [place].

## 2012-11-24 ENCOUNTER — Ambulatory Visit (INDEPENDENT_AMBULATORY_CARE_PROVIDER_SITE_OTHER): Payer: PRIVATE HEALTH INSURANCE | Admitting: General Surgery

## 2013-01-23 ENCOUNTER — Encounter (HOSPITAL_BASED_OUTPATIENT_CLINIC_OR_DEPARTMENT_OTHER): Payer: Self-pay | Admitting: *Deleted

## 2013-01-23 NOTE — Progress Notes (Signed)
No labs needed-he thought this was local-will talk to dr Clarisse Gouge

## 2013-01-25 ENCOUNTER — Encounter (HOSPITAL_BASED_OUTPATIENT_CLINIC_OR_DEPARTMENT_OTHER): Payer: Self-pay | Admitting: Certified Registered"

## 2013-01-28 ENCOUNTER — Telehealth (INDEPENDENT_AMBULATORY_CARE_PROVIDER_SITE_OTHER): Payer: Self-pay | Admitting: Surgery

## 2013-01-28 NOTE — Telephone Encounter (Addendum)
[name] gave me a call about a cyst on his scalp.    He had seen Neysa Bonito for this scalp lesion.  Richardson Landry planned to remove the scalp lesion under general anesthesia and [name] was quoted a price of $3,000+ by [facility] (I think).  He asked if I could do the surgery.  I saw [name] at [facility] while he was working Thursday night, 1/22.   He has a 3 cm cyst on the left top of his scalp.  He said that Collie Siad aspirated stuff out of it, but it came right back.  I told him I thought I could do it under local, which should cost less.  [facility] was [facility] to the [facility] minor surgery fee and let us know where he wants this done.  [employer]

## 2013-01-29 ENCOUNTER — Ambulatory Visit (HOSPITAL_BASED_OUTPATIENT_CLINIC_OR_DEPARTMENT_OTHER): Admission: RE | Admit: 2013-01-29 | Payer: PRIVATE HEALTH INSURANCE | Source: Ambulatory Visit | Admitting: Surgery

## 2013-01-29 HISTORY — DX: Encounter for fitting and adjustment of spectacles and contact lenses: Z46.0

## 2013-01-29 HISTORY — DX: Presence of external hearing-aid: Z97.4

## 2013-01-29 SURGERY — EXCISION MASS
Anesthesia: General

## 2013-01-30 ENCOUNTER — Other Ambulatory Visit (INDEPENDENT_AMBULATORY_CARE_PROVIDER_SITE_OTHER): Payer: Self-pay | Admitting: Surgery

## 2013-02-06 ENCOUNTER — Encounter (HOSPITAL_BASED_OUTPATIENT_CLINIC_OR_DEPARTMENT_OTHER): Payer: Self-pay | Admitting: *Deleted

## 2013-02-06 ENCOUNTER — Encounter (HOSPITAL_BASED_OUTPATIENT_CLINIC_OR_DEPARTMENT_OTHER)
Admission: RE | Disposition: A | Discharge status: Home / Self Care | Payer: Self-pay | Source: Ambulatory Visit | Attending: Surgery

## 2013-02-06 ENCOUNTER — Ambulatory Visit (HOSPITAL_BASED_OUTPATIENT_CLINIC_OR_DEPARTMENT_OTHER)
Admission: RE | Admit: 2013-02-06 | Discharge: 2013-02-06 | Disposition: A | Discharge status: Home / Self Care | Payer: PRIVATE HEALTH INSURANCE | Source: Ambulatory Visit | Attending: Surgery | Admitting: Surgery

## 2013-02-06 DIAGNOSIS — F329 Major depressive disorder, single episode, unspecified: Secondary | ICD-10-CM | POA: Insufficient documentation

## 2013-02-06 DIAGNOSIS — F3289 Other specified depressive episodes: Secondary | ICD-10-CM | POA: Insufficient documentation

## 2013-02-06 DIAGNOSIS — R229 Localized swelling, mass and lump, unspecified: Secondary | ICD-10-CM

## 2013-02-06 DIAGNOSIS — R220 Localized swelling, mass and lump, head: Secondary | ICD-10-CM

## 2013-02-06 DIAGNOSIS — E785 Hyperlipidemia, unspecified: Secondary | ICD-10-CM | POA: Insufficient documentation

## 2013-02-06 DIAGNOSIS — L723 Sebaceous cyst: Secondary | ICD-10-CM | POA: Insufficient documentation

## 2013-02-06 HISTORY — PX: LESION REMOVAL: SHX5196

## 2013-02-06 SURGERY — MINOR EXCISION OF LESION
Anesthesia: LOCAL

## 2013-02-06 MED ORDER — LIDOCAINE-EPINEPHRINE 1 %-1:100000 IJ SOLN
INTRAMUSCULAR | Status: DC | PRN
  Administered 2013-02-06: 9 mL

## 2013-02-06 MED ORDER — BACITRACIN ZINC 500 UNIT/GM EX OINT
TOPICAL_OINTMENT | CUTANEOUS | Status: DC | PRN
  Administered 2013-02-06: 1 via TOPICAL

## 2013-02-06 MED ORDER — POVIDONE-IODINE 10 % EX OINT
TOPICAL_OINTMENT | CUTANEOUS | Status: AC
  Filled 2013-02-06: qty 28.35

## 2013-02-06 MED ORDER — CHLORHEXIDINE GLUCONATE 4 % EX LIQD: 1.0000 "application " | Freq: Once | CUTANEOUS | Status: DC

## 2013-02-06 MED ORDER — LIDOCAINE-EPINEPHRINE 1 %-1:100000 IJ SOLN
INTRAMUSCULAR | Status: AC
  Filled 2013-02-06: qty 1

## 2013-02-06 SURGICAL SUPPLY — 32 items
APL SKNCLS STERI-STRIP NONHPOA (GAUZE/BANDAGES/DRESSINGS)
BANDAGE ADH SHEER 1  50/CT (GAUZE/BANDAGES/DRESSINGS) IMPLANT
BENZOIN TINCTURE PRP APPL 2/3 (GAUZE/BANDAGES/DRESSINGS) IMPLANT
BLADE SURG 15 STRL LF DISP TIS (BLADE) ×1 IMPLANT
BLADE SURG 15 STRL SS (BLADE) ×2
DRAPE UTILITY XL STRL (DRAPES) ×2 IMPLANT
GLOVE BIOGEL PI IND STRL 7.0 (GLOVE) IMPLANT
GLOVE BIOGEL PI INDICATOR 7.0 (GLOVE) ×1
GLOVE SURG SIGNA 7.5 PF LTX (GLOVE) ×2 IMPLANT
GLOVE SURG SS PI 7.0 STRL IVOR (GLOVE) ×2 IMPLANT
MARKER SKIN DUAL TIP RULER LAB (MISCELLANEOUS) ×2 IMPLANT
NDL HYPO 25X1 1.5 SAFETY (NEEDLE) IMPLANT
NDL SAFETY ECLIPSE 18X1.5 (NEEDLE) ×1 IMPLANT
NEEDLE HYPO 18GX1.5 SHARP (NEEDLE) ×2
NEEDLE HYPO 25X1 1.5 SAFETY (NEEDLE) IMPLANT
NEEDLE HYPO 25X5/8 SAFETYGLIDE (NEEDLE) ×2 IMPLANT
NS IRRIG 1000ML POUR BTL (IV SOLUTION) IMPLANT
SPONGE GAUZE 4X4 12PLY STER LF (GAUZE/BANDAGES/DRESSINGS) IMPLANT
STRIP CLOSURE SKIN 1/2X4 (GAUZE/BANDAGES/DRESSINGS) IMPLANT
STRIP CLOSURE SKIN 1/4X4 (GAUZE/BANDAGES/DRESSINGS) IMPLANT
SUT ETHILON 2 0 FS 18 (SUTURE) ×1 IMPLANT
SUT ETHILON 3 0 PS 1 (SUTURE) IMPLANT
SUT ETHILON 4 0 PS 2 18 (SUTURE) IMPLANT
SUT ETHILON 5 0 P 3 18 (SUTURE)
SUT NYLON ETHILON 5-0 P-3 1X18 (SUTURE) IMPLANT
SUT VIC AB 5-0 PC1 18 (SUTURE) IMPLANT
SUT VICRYL 3-0 CR8 SH (SUTURE) IMPLANT
SUT VICRYL 4-0 PS2 18IN ABS (SUTURE) IMPLANT
SWABSTICK POVIDONE IODINE SNGL (MISCELLANEOUS) ×4 IMPLANT
SYR CONTROL 10ML LL (SYRINGE) ×2 IMPLANT
TOWEL OR NON WOVEN STRL DISP B (DISPOSABLE) ×2 IMPLANT
UNDERPAD 30X30 INCONTINENT (UNDERPADS AND DIAPERS) IMPLANT

## 2013-02-06 NOTE — Discharge Instructions (Signed)
[facility] [place] [facility] - DISCHARGE INSTRUCTIONS TO PATIENT  Wound Care:   May shower and wash wound in 24 hours.  Follow up appointment:  Can remove sutures in 2 weeks -  Call Dr. Pollie Friar office [facility]) at [phone] for any question  Medications and dosages:  Resume your home medications.  May resume aspirin in 48 hours.  Call Dr. Lucia Gaskins or his office  [phone]) if you have:  Temperature greater than 100.4,  Redness, tenderness, or signs of infection (pain, swelling, redness, odor or green/yellow discharge around the site),  Any other questions or concerns you may have after discharge.  In an emergency, call 911 or go to an Emergency Department at a nearby hospital.

## 2013-02-06 NOTE — H&P (Signed)
Howe, MD,  [address]  [place], [place]    [place] Phone:  [phone] FAX:  [phone]   Re:   [name] DOB:   1950/04/21 MRN:   [id number]  ASSESSMENT AND PLAN: 1.  Cyst of scalp - 3 cm   I discussed excising this lesion.  It can probably be done under local.  Risks include, but are not limit to, bleeding, infection, nerve injury and recurrence.  2. Stable subcutaneous masses both arms - probable lipomas.   HISTORY OF PRESENT ILLNESS: Cyst of scalp  [name] is a 63 y.o. (DOB: 02-28-1950)  white  male who is a patient of MITCHELL,DEAN, MD and comes to me today for excision of a cyst of the scalp.  Past Medical History  Diagnosis Date  . Depression   . Hyperlipidemia   . Adenomatous polyp of colon   . Diverticulitis of colon 06/2010    recurrent  . Contact lens/glasses fitting     wears contacts or glasses  . Wears hearing aid     both ears   Current Facility-Administered Medications  Medication Dose Route Frequency Provider Last Rate Last Dose  . chlorhexidine (HIBICLENS) 4 % liquid 1 application  1 application Topical Once Shann Medal, MD      . Derrill Memo ON 02/07/2013] chlorhexidine (HIBICLENS) 4 % liquid 1 application  1 application Topical Once Shann Medal, MD        SOCIAL HISTORY: Married. Works as Stage manager  PHYSICAL EXAM: BP 142/83  Pulse 92  Temp(Src) 97.4 F (36.3 C) (Oral)  Resp 16  SpO2 96%  General - WN WM who is alert. Scalp - 3 cm cyst on top of scalp.  No other obvious lesion.  DATA REVIEWED: Epic notes   Alphonsa Overall, MD,  [facility], [address]  [place], [place]    [place] Phone:  [phone] FAX:  [phone]

## 2013-02-07 NOTE — Op Note (Signed)
NAMERICKI, CLACK NO.:  [id number]  MEDICAL RECORD NO.:  [id number]  LOCATION:                                 FACILITY:  PHYSICIAN:  Fenton Malling. Lucia Gaskins, M.D.  DATE OF BIRTH:  [birth year]  DATE OF PROCEDURE:  02/06/2013                              OPERATIVE REPORT   PREOPERATIVE DIAGNOSIS:  A 3.8-cm cyst of scalp.  POSTOPERATIVE DIAGNOSIS:  A 3.8-cm cyst of scalp.  PROCEDURE:  Excision of cyst of scalp.  SURGEON:  Fenton Malling. Lucia Gaskins, M.D.  ANESTHESIA:  10 mL of 1% Xylocaine with epinephrine.  COMPLICATIONS:  None.  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  His wife aspirated the cyst at least one time and it has recurred and now he comes for excision of the cyst.  The indications, potential complications of cyst excision were explained to the patient.  Potential complications include, but are not limited to, bleeding, infection, nerve injury, and recurrence of the cyst.  OPERATIVE NOTE:  The patient was taken to room #3 at [facility] Day surgery as a minor surgery room.  A time-out was held and the surgical checklist run.    I shaved area of his hair approximately 3 x 4 cm, so I could see the cyst well.  I painted the area with Betadine solution and infiltrated the skin with 10 mL 1% Xylocaine.  I then excised the cyst in its entirety.  I excised the entire cyst wall.  Hemostasis was obtained with pressure.  I then closed the wound with interrupted 2-0 nylon sutures x 3 and then painted the wound with bacitracin ointment.  The patient tolerated the procedure well.  We will remove the sutures in 2 weeks.  We will send the specimen off for pathology.  Fenton Malling. Lucia Gaskins, M.D., FACS   DHN/MEDQ  D:  02/06/2013  T:  02/07/2013  Job:  [id number]  cc:   L. Donnie Coffin, M.D.

## 2013-02-08 ENCOUNTER — Encounter (HOSPITAL_BASED_OUTPATIENT_CLINIC_OR_DEPARTMENT_OTHER): Payer: Self-pay | Admitting: Surgery

## 2014-03-07 ENCOUNTER — Ambulatory Visit (HOSPITAL_BASED_OUTPATIENT_CLINIC_OR_DEPARTMENT_OTHER): Payer: PRIVATE HEALTH INSURANCE | Attending: Cardiovascular Disease

## 2014-03-07 VITALS — Ht 70.0 in | Wt 205.0 lb

## 2014-03-07 DIAGNOSIS — R0683 Snoring: Secondary | ICD-10-CM | POA: Diagnosis not present

## 2014-03-07 DIAGNOSIS — G4730 Sleep apnea, unspecified: Secondary | ICD-10-CM | POA: Diagnosis present

## 2014-03-07 DIAGNOSIS — G4733 Obstructive sleep apnea (adult) (pediatric): Secondary | ICD-10-CM | POA: Insufficient documentation

## 2014-03-08 NOTE — Addendum Note (Signed)
Addended by: Shelva Majestic A on: 03/08/2014 05:50 PM   Modules accepted: Level of Service

## 2014-03-08 NOTE — Sleep Study (Signed)
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  He admits to loud snoring, witnessed apnea, frequent awakenings and nonrestorative sleep.  EPWORTH SLEEPINESS SCORE:  7 HEIGHT: 5\' 10"  (177.8 cm)  WEIGHT: 205 lb (92.987 kg)    Body mass index is 29.41 kg/(m^2).  NECK SIZE: 17 in.  MEDICATIONS:  venlafaxine (EFFEXOR) 75 MG tablet 75 mg, 2 times daily buPROPion (WELLBUTRIN XL) 150 MG 24 hr tablet 150    SLEEP ARCHITECTURE:  On the diagnostic portion of the study the patient slept for 126.5 minutes out of a sleep period of time of 157.5 minutes.  Sleep efficiency was mildly reduced at 76.7%.  Sleep onset was 7.5 minutes.  Latency to REM sleep was prolonged at 144 minutes.  The patient slept for 45 minutes (38.3%) in stage I, 64.5 minutes (51%) in stage II, 0 minutes in stage III, and 13.5 minutes (10.7%), in REM sleep.  He spent 91 minutes (71.9%) in supine sleep and 35.5 minutes (28.1%) in nonsupine sleep.  Sleep architecture is abnormal with reduced slow wave sleep and delayed latency to REM sleep.  There were a total of 15 arousals with an index of 7.1 per hour. There was very loud snoring.  On the titration portion of the study, he slept 10.5 minutes in stage I (5.5%), 53.5 minutes in stage II, and (28.2%), 0 minutes in stage III sleep, and 126 minutes (66.3%) in REM sleep.  This high percentage of REM sleep suggests REM rebound.  There was one arousal with an index of 0.3 per hour.  With CPAP therapy, there was complete resolution of snoring   RESPIRATORY DATA:  On the diagnostic portion of the study, there were 45  obstructive apneas, 0 central apneas, 0 mixed apneas, and 32 hypopneas.  The apnea hypopnea index (AHI) was 36.5 per hour overall and during REM sleep was 75.6 per hour.  This places the patient into the severe sleep apnea category.  There was a significant positional component with the AHI during supine sleep at 50.1 per hour and the AHI with nonsupine sleep at 1.7 per hour.  CPAP titration was started at 4 cm water pressure and increased to 14 cm water pressure.  The AHI at 14 cm water pressure was 0.  REM supine sleep was achieved throughout the titration.   OXYGEN DATA:  The baseline oxygen saturation was 96%.  The nadir with non-REM sleep was 83% and with REM sleep was 85%.  The lowest oxygen desaturation during CPAP therapy was 84% at 5 cm water pressure.  CARDIAC DATA:  The patient was in sinus rhythm with an average heart rate at 74 bpm.  There were rare isolated unifocal PVCs.  MOVEMENT/PARASOMNIA:  There were 0 periodic limb movements.  IMPRESSION/ RECOMMENDATION:   Severe obstructive sleep apnea/hypoxia syndrome.  Events were worse in the supine position and during REM sleep. Respiratory events with oxygen desaturation to a nadir of 83% with non-REM sleep. Loud snoring on the baseline portion of the study with complete resolution with CPAP therapy. Abnormal sleep architecture related to respiratory events. Reduced REM  sleep on the diagnostic portion of the study and "REM rebound" with CPAP therapy.  Recommend initial use of CPAP with EPR of 3 at 14 cm water pressure with a heated humidifier.  An F&P Simplus FFM size medium was used for the CPAP titration.  Recommend attempt at trying to avoid sleep in the supine position.  Recommend a download be obtained in 30 days and sleep clinic follow-up.   [place], American Board of Sleep Medicine  ELECTRONICALLY SIGNED ON:  03/08/2014, 5:00 PM [place] PH: [phone]   FX: [phone]  [place]

## 2014-03-18 ENCOUNTER — Encounter: Payer: Self-pay | Admitting: Cardiovascular Disease

## 2014-03-21 ENCOUNTER — Telehealth: Payer: Self-pay

## 2014-03-21 NOTE — Telephone Encounter (Signed)
Faxed CPAP orders back to [facility]

## 2014-10-17 ENCOUNTER — Encounter: Payer: Self-pay | Admitting: Gastroenterology

## 2015-08-07 ENCOUNTER — Encounter: Payer: Self-pay | Admitting: Gastroenterology

## 2015-10-21 ENCOUNTER — Other Ambulatory Visit (HOSPITAL_COMMUNITY): Payer: Self-pay | Admitting: Diagnostic Radiology

## 2015-10-21 ENCOUNTER — Other Ambulatory Visit: Payer: Self-pay | Admitting: Urology

## 2015-10-21 ENCOUNTER — Ambulatory Visit
Admission: RE | Admit: 2015-10-21 | Discharge: 2015-10-21 | Disposition: A | Discharge status: Home / Self Care | Payer: No Typology Code available for payment source | Source: Ambulatory Visit | Attending: Urology | Admitting: Urology

## 2015-10-21 DIAGNOSIS — R972 Elevated prostate specific antigen [PSA]: Secondary | ICD-10-CM

## 2015-10-21 MED ORDER — GADOBENATE DIMEGLUMINE 529 MG/ML IV SOLN
20.0000 mL | Freq: Once | INTRAVENOUS | Status: AC | PRN
  Administered 2015-10-21: 20 mL via INTRAVENOUS

## 2015-11-11 ENCOUNTER — Other Ambulatory Visit: Payer: Self-pay | Admitting: Family Medicine

## 2015-11-11 ENCOUNTER — Ambulatory Visit
Admission: RE | Admit: 2015-11-11 | Discharge: 2015-11-11 | Disposition: A | Discharge status: Home / Self Care | Payer: PRIVATE HEALTH INSURANCE | Source: Ambulatory Visit | Attending: Family Medicine | Admitting: Family Medicine

## 2015-11-11 DIAGNOSIS — M79605 Pain in left leg: Secondary | ICD-10-CM

## 2017-08-08 ENCOUNTER — Ambulatory Visit (HOSPITAL_COMMUNITY)
Admission: RE | Admit: 2017-08-08 | Discharge: 2017-08-08 | Disposition: A | Discharge status: Home / Self Care | Payer: PRIVATE HEALTH INSURANCE | Source: Ambulatory Visit | Attending: Nurse Practitioner | Admitting: Nurse Practitioner

## 2017-08-08 ENCOUNTER — Encounter (HOSPITAL_COMMUNITY): Payer: Self-pay | Admitting: Nurse Practitioner

## 2017-08-08 VITALS — BP 126/64 | HR 65 | Ht 70.0 in | Wt 235.0 lb

## 2017-08-08 DIAGNOSIS — M961 Postlaminectomy syndrome, not elsewhere classified: Secondary | ICD-10-CM | POA: Insufficient documentation

## 2017-08-08 DIAGNOSIS — E785 Hyperlipidemia, unspecified: Secondary | ICD-10-CM | POA: Insufficient documentation

## 2017-08-08 DIAGNOSIS — R002 Palpitations: Secondary | ICD-10-CM | POA: Diagnosis present

## 2017-08-08 DIAGNOSIS — Z9889 Other specified postprocedural states: Secondary | ICD-10-CM | POA: Insufficient documentation

## 2017-08-08 DIAGNOSIS — Z800 Family history of malignant neoplasm of digestive organs: Secondary | ICD-10-CM | POA: Insufficient documentation

## 2017-08-08 MED ORDER — DILTIAZEM HCL 30 MG PO TABS: ORAL_TABLET | ORAL | 0 refills | Status: DC

## 2017-08-09 NOTE — Progress Notes (Signed)
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  He noted starting Friday pm as he was trying to go to sleep, an irregular irregularity to his pulse that was fast. He asked his wife, whom is a nurse to confirm. It lasted around 2 hours and resolved. He had the same symptoms for the next 2 nights. It is notable that since he was on vacation he was having around 4 alcoholic drinks in the evenings, which is not his routine at home. He does not use tobacco, moderate caffeine, is compliant with use of CPAP and did use on vacation. No regular exercise program. He mentioned he thought he had afib episodes at the beach, to Dr. Claiborne Billings, who asked me if I could see the pt today.  He has a chadsvasc score of 1 (age), currently on a baby ASA and is in SR today and feels well.  Today, he denies symptoms of palpitations, chest pain, shortness of breath, orthopnea, PND, lower extremity edema, dizziness, presyncope, syncope, or neurologic sequela. The patient is tolerating medications without difficulties and is otherwise without complaint today.   Past Medical History:  Diagnosis Date  . Adenomatous polyp of colon   . Contact lens/glasses fitting    wears contacts or glasses  . Depression   . Diverticulitis of colon 06/2010   recurrent  . Hyperlipidemia   . Wears hearing aid    both ears   Past Surgical History:  Procedure Laterality Date  . CERVICAL LAMINECTOMY  1992  . COLONOSCOPY    . KNEE ARTHROSCOPY  2012   bilateral  . LESION REMOVAL N/A 02/06/2013   Procedure: MINOR EXICISION OF SCALP LESION;  Surgeon: Shann Medal, MD;  Location: [place];  Service: General;  Laterality: N/A;  . [place] &1977  . ROTATOR CUFF REPAIR  2008   left  .  SHOULDER ARTHROSCOPY  2008   right    Current Outpatient Medications  Medication Sig Dispense Refill  . aspirin EC 81 MG tablet Take 81 mg by mouth daily.    . TRINTELLIX 20 MG TABS tablet 1 tablet daily.    Marland Kitchen diltiazem (CARDIZEM) 30 MG tablet Take one tablet by mouth every 4 hours AS NEEDED for A-fib HR > 100 as long as BP > 100. 30 tablet 0   No current facility-administered medications for this encounter.     No Known Allergies  Social History   Socioeconomic History  . Marital status: Married    Spouse name: Not on file  . Number of children: Not on file  . Years of education: Not on file  . Highest education level: Not on file  Occupational History  . 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 Financial resource strain: Not on file  . Food insecurity:    Worry: Not on file    Inability: Not on file  . Transportation needs:    Medical: Not on file    Non-medical: Not on file  Tobacco Use  . Smoking status: Never Smoker  . Smokeless tobacco: Never Used  Substance and Sexual Activity  . Alcohol use: Yes    Alcohol/week: 4.2  oz    Types: 7 Glasses of wine per week  . Drug use: No  . Sexual activity: Not on file  Lifestyle  . Physical activity:    Days per week: Not on file    Minutes per session: Not on file  . Stress: Not on file  Relationships  . Social connections:    Talks on phone: Not on file    Gets together: Not on file    Attends religious service: Not on file    Active member of club or organization: Not on file    Attends meetings of clubs or organizations: Not on file    Relationship status: Not on file  . Intimate partner violence:    Fear of current or ex partner: Not on file    Emotionally abused: Not on file    Physically abused: Not on file    Forced sexual activity: Not on file  Other Topics Concern  . Not on file  Social History Narrative  . Not on file     Family History  Problem Relation Age of Onset  . Colon cancer Father   . Colon cancer Maternal Grandmother     ROS- All systems are reviewed and negative except as per the HPI above  Physical Exam: Vitals:   08/08/17 1543  BP: 126/64  Pulse: 65  Weight: 235 lb (106.6 kg)  Height: 5\' 10"  (1.778 m)   Wt Readings from Last 3 Encounters:  08/08/17 235 lb (106.6 kg)  03/07/14 205 lb (93 kg)  01/23/13 208 lb (94.3 kg)    Labs: No results found for: NA, K, CL, CO2, GLUCOSE, BUN, CREATININE, CALCIUM, PHOS, MG No results found for: INR No results found for: CHOL, HDL, LDLCALC, TRIG   GEN- The patient is well appearing, alert and oriented x 3 today.   Head- normocephalic, atraumatic Eyes-  Sclera clear, conjunctiva pink Ears- hearing intact Oropharynx- clear Neck- supple, no JVP Lymph- no cervical lymphadenopathy Lungs- Clear to ausculation bilaterally, normal work of breathing Heart- Regular rate and rhythm, no murmurs, rubs or gallops, PMI not laterally displaced GI- soft, NT, ND, + BS Extremities- no clubbing, cyanosis, or edema MS- no significant deformity or atrophy Skin- no rash or lesion Psych- euthymic mood, full affect Neuro- strength and sensation are intact  EKG-NSR with possible left atrial enlargement, NSST abnormality,PR int 146 ms, qrs int 78 ms, qtc 418 ms Epic records reviewed    Assessment and Plan: 1. Palpitations By pt's description, it sounds very likely paroxysmal afib He will diminish alcohol intake, to no more than 2 a week, normally does not drink that much alcohol during his work week, as on vacation Limit caffeine to 2-3 servings a day Continue use of CPAP Zio patch, 2 week monitor to be placed Echo Will see if episodes lessen now that pt is back in his routine and not prescribe daily BB for now Will Rx Cardizem 30 mg as needed for episodes of elevated HR Regular exercise routine  2. Chadsvasc score of 1(male)  For now, continue  with baby ASA  Pt will ask Dr. Claiborne Billings if he will take him on for cardiology f/u   Butch Penny C. Oliver Neuwirth, [facility] [address] [place], [place] [address] [phone]

## 2017-08-11 ENCOUNTER — Ambulatory Visit (HOSPITAL_COMMUNITY): Payer: PRIVATE HEALTH INSURANCE

## 2017-08-11 ENCOUNTER — Ambulatory Visit (HOSPITAL_COMMUNITY)
Admission: RE | Admit: 2017-08-11 | Discharge: 2017-08-11 | Disposition: A | Discharge status: Home / Self Care | Payer: PRIVATE HEALTH INSURANCE | Source: Ambulatory Visit | Attending: Nurse Practitioner | Admitting: Nurse Practitioner

## 2017-08-11 DIAGNOSIS — R002 Palpitations: Secondary | ICD-10-CM | POA: Insufficient documentation

## 2017-08-11 DIAGNOSIS — E785 Hyperlipidemia, unspecified: Secondary | ICD-10-CM | POA: Insufficient documentation

## 2017-08-11 DIAGNOSIS — I4891 Unspecified atrial fibrillation: Secondary | ICD-10-CM | POA: Diagnosis not present

## 2017-08-11 NOTE — Progress Notes (Signed)
  Echocardiogram 2D Echocardiogram has been performed.  [name] M 08/11/2017, 8:46 AM

## 2017-09-07 ENCOUNTER — Telehealth (HOSPITAL_COMMUNITY): Payer: Self-pay | Admitting: *Deleted

## 2017-09-07 ENCOUNTER — Other Ambulatory Visit (HOSPITAL_COMMUNITY): Payer: Self-pay | Admitting: *Deleted

## 2017-09-07 DIAGNOSIS — I4892 Unspecified atrial flutter: Secondary | ICD-10-CM

## 2017-09-07 MED ORDER — METOPROLOL SUCCINATE ER 25 MG PO TB24: 25.0000 mg | ORAL_TABLET | Freq: Every day | ORAL | 3 refills | Status: DC

## 2017-09-07 NOTE — Telephone Encounter (Signed)
Per Roderic Palau NP - nonsustained vtach and aflutter on monitor. Will start metoprolol succinate 25mg  once a day at bedtime. May reduce to 1/2 tablet if issues with hypotension or fatigue from BB. Will also request appt to establish with Dr. Rayann Heman in 2-4 weeks. Pt in agreement with plan.

## 2017-09-22 ENCOUNTER — Encounter: Payer: Self-pay | Admitting: Internal Medicine

## 2017-09-22 ENCOUNTER — Ambulatory Visit (INDEPENDENT_AMBULATORY_CARE_PROVIDER_SITE_OTHER): Payer: PRIVATE HEALTH INSURANCE | Admitting: Internal Medicine

## 2017-09-22 ENCOUNTER — Encounter (INDEPENDENT_AMBULATORY_CARE_PROVIDER_SITE_OTHER): Payer: Self-pay

## 2017-09-22 VITALS — BP 132/68 | HR 80 | Ht 70.0 in | Wt 236.8 lb

## 2017-09-22 DIAGNOSIS — R002 Palpitations: Secondary | ICD-10-CM

## 2017-09-22 DIAGNOSIS — I480 Paroxysmal atrial fibrillation: Secondary | ICD-10-CM | POA: Diagnosis not present

## 2017-09-22 DIAGNOSIS — G4733 Obstructive sleep apnea (adult) (pediatric): Secondary | ICD-10-CM

## 2017-09-22 MED ORDER — DILTIAZEM HCL 30 MG PO TABS: ORAL_TABLET | ORAL | 11 refills | Status: DC

## 2017-09-22 NOTE — Patient Instructions (Signed)

## 2017-09-22 NOTE — Progress Notes (Signed)
Electrophysiology Office Note   Date:  09/22/2017   ID:  Tyrone Seat, [name], DOB 02/21/1950, MRN [id number]  PCP:  Aurea Graff.[name], [name]  Cardiologist:  Dr Claiborne Billings Primary Electrophysiologist: Thompson Grayer, [name]    CC: arrhythmias   History of Present Illness: Tyrone Seat, [name] is a 67 y.o. male who presents today for electrophysiology evaluation.   He is referred by Roderic Palau NP and Dr Claiborne Billings for EP evaluation of arrhythmias.  He is a Stage manager at [employer].  He has a h/o OSA.  He recently was evaluated in the AF clinic with palpitations.  He reports that while at the beach in August, he has palpitations.  He had drank more ETOh than usual.   He had ZIO patch monitor placed.  This documented short NSVT, ectopic atrial tachycardia (nonsustained), and nocturnal afib.  He was asymptomatic with these.  He has had no further palpitations since that time. He is currently at his baseline health state. Today, he denies symptoms of  chest pain, shortness of breath, orthopnea, PND, lower extremity edema, claudication, dizziness, presyncope, syncope, bleeding, or neurologic sequela. The patient is tolerating medications without difficulties and is otherwise without complaint today.    Past Medical History:  Diagnosis Date  . Adenomatous polyp of colon   . Contact lens/glasses fitting    wears contacts or glasses  . Depression   . Diverticulitis of colon 06/2010   recurrent  . Hyperlipidemia   . Obstructive sleep apnea    uses CPAP, followed by Dr Claiborne Billings  . Wears hearing aid    both ears   Past Surgical History:  Procedure Laterality Date  . CERVICAL LAMINECTOMY  1992  . COLONOSCOPY    . KNEE ARTHROSCOPY  2012   bilateral  . LESION REMOVAL N/A 02/06/2013   Procedure: MINOR EXICISION OF SCALP LESION;  Surgeon: Shann Medal, [name];  Location: [place];  Service: General;  Laterality: N/A;  . [place] &1977  . ROTATOR CUFF REPAIR  2008   left  . SHOULDER  ARTHROSCOPY  2008   right     Current Outpatient Medications  Medication Sig Dispense Refill  . TRINTELLIX 20 MG TABS tablet 1 tablet daily.    Marland Kitchen diltiazem (CARDIZEM) 30 MG tablet Take one tablet by mouth every 4 hours as needed for afib HR>100 30 tablet 11   No current facility-administered medications for this visit.     Allergies:   Patient has no known allergies.   Social History:  The patient  reports that he has never smoked. He has never used smokeless tobacco. He reports that he drinks about 7.0 standard drinks of alcohol per week. He reports that he does not use drugs.   Family History:  The patient's family history includes Colon cancer in his father and maternal grandmother.    ROS:  Please see the history of present illness.   All other systems are personally reviewed and negative.    PHYSICAL EXAM: VS:  BP 132/68   Pulse 80   Ht 5\' 10"  (1.778 m)   Wt 236 lb 12.8 oz (107.4 kg)   SpO2 97%   BMI 33.98 kg/m  , BMI Body mass index is 33.98 kg/m. GEN: Well nourished, well developed, in no acute distress  HEENT: normal  Neck: no JVD, carotid bruits, or masses Cardiac: RRR; no murmurs, rubs, or gallops,no edema  Respiratory:  clear to auscultation bilaterally, normal work of breathing GI:  soft, nontender, nondistended, + BS MS: no deformity or atrophy  Skin: warm and dry  Neuro:  Strength and sensation are intact Psych: euthymic mood, full affect  EKG:  EKG is ordered today. The ekg ordered today is personally reviewed and shows sinus rhythm 68 bpm, PR 156 msec, QRS 76 msec, Qtc 431 msec, nonspecific ST/T changes   Lipid Panel  No results found for: CHOL, TRIG, HDL, CHOLHDL, VLDL, LDLCALC, LDLDIRECT personally reviewed   Wt Readings from Last 3 Encounters:  09/22/17 236 lb 12.8 oz (107.4 kg)  08/08/17 235 lb (106.6 kg)  03/07/14 205 lb (93 kg)     Other studies personally reviewed: Additional studies/ records that were reviewed today include: AF clinic  notes, prior ekg,  Echo 08/11/17 Review of the above records today demonstrates: EF 55-60%, no WMA, normal RV size and function   ASSESSMENT AND PLAN:  1.  NSVT Low risk echo, ekg No ischemic symptoms Will follow conservatively  2. Nonsustained atach/ nocturnal afib Mostly asymptomatic chads2vasc score is 1.  As per AHA/ACC guidelines, will not start anticoagulation at this time. He has prn cardizem. We discussed Cardiofit/ Legacy trials and the importance of lifestyle modification at length today.  3. OSA Compliance with CPAP encoaraged   Follow-up:  Return in a year unless problems arise in the interim.  Current medicines are reviewed at length with the patient today.   The patient does not have concerns regarding his medicines.  The following changes were made today:  none  Labs/ tests ordered today include:  Orders Placed This Encounter  Procedures  . EKG 12-Lead     Signed, Thompson Grayer, [name]  09/22/2017 3:11 PM     [place] [address] [phone] (office) [phone] (fax)

## 2018-04-11 ENCOUNTER — Telehealth (HOSPITAL_COMMUNITY): Payer: Self-pay | Admitting: *Deleted

## 2018-04-11 ENCOUNTER — Other Ambulatory Visit (HOSPITAL_COMMUNITY): Payer: Self-pay | Admitting: *Deleted

## 2018-04-11 MED ORDER — DILTIAZEM HCL 30 MG PO TABS: ORAL_TABLET | ORAL | 3 refills | Status: DC

## 2018-04-11 NOTE — Telephone Encounter (Signed)
Patient called in stating he feels like he went into afib about 2 hours ago which is probably his longest episode that he is aware of. He doesn't feel like his heart is racing but definitely feels irregular. Pt reminded of instructions on how to use PRN cardizem as needed for breakthrough afib. He is currently at work but will pickup prescription (doesn't think he ever had it filled) and use it this evening if AF should continue. If AF persist he will call back in tomorrow for possible med addition. Pt in agreement. States no change in health, diet etc within the last few weeks.

## 2018-04-25 DIAGNOSIS — Z0000 Encounter for general adult medical examination without abnormal findings: Secondary | ICD-10-CM | POA: Diagnosis not present

## 2018-09-29 ENCOUNTER — Telehealth: Payer: Self-pay

## 2018-09-29 NOTE — Telephone Encounter (Signed)
Left a message regarding appt on 10/02/18.

## 2018-10-02 ENCOUNTER — Encounter: Payer: Self-pay | Admitting: Internal Medicine

## 2018-10-02 ENCOUNTER — Telehealth (INDEPENDENT_AMBULATORY_CARE_PROVIDER_SITE_OTHER): Payer: PRIVATE HEALTH INSURANCE | Admitting: Internal Medicine

## 2018-10-02 VITALS — BP 120/70 | HR 68 | Ht 70.0 in | Wt 225.0 lb

## 2018-10-02 DIAGNOSIS — G4733 Obstructive sleep apnea (adult) (pediatric): Secondary | ICD-10-CM

## 2018-10-02 DIAGNOSIS — I480 Paroxysmal atrial fibrillation: Secondary | ICD-10-CM | POA: Diagnosis not present

## 2018-10-02 NOTE — Progress Notes (Signed)
Electrophysiology TeleHealth Note   Due to national recommendations of social distancing due to COVID 19, an audio/video telehealth visit is felt to be most appropriate for this patient at this time.  See MyChart message from today for the patient's consent to telehealth for [facility].   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  Since last being seen in our clinic, the patient reports doing very well.  He is very active and has done strenuous yard work recently without any symptoms.   Today, he denies symptoms of palpitations, chest pain, shortness of breath,  lower extremity edema, dizziness, presyncope, or syncope.  The patient is otherwise without complaint today.  The patient denies symptoms of fevers, chills, cough, or new SOB worrisome for COVID 19.  Past Medical History:  Diagnosis Date  . Adenomatous polyp of colon   . Contact lens/glasses fitting    wears contacts or glasses  . Depression   . Diverticulitis of colon 06/2010   recurrent  . Hyperlipidemia   . Obstructive sleep apnea    uses CPAP, followed by Dr Claiborne Billings  . Wears hearing aid    both ears    Past Surgical History:  Procedure Laterality Date  . CERVICAL LAMINECTOMY  1992  . COLONOSCOPY    . KNEE ARTHROSCOPY  2012   bilateral  . LESION REMOVAL N/A 02/06/2013   Procedure: MINOR EXICISION OF SCALP LESION;  Surgeon: Shann Medal, MD;  Location: [place];  Service: General;  Laterality: N/A;  . [place] &1977  . ROTATOR CUFF REPAIR  2008   left  . SHOULDER ARTHROSCOPY  2008   right     Current Outpatient Medications  Medication Sig Dispense Refill  . TRINTELLIX 20 MG TABS tablet 1 tablet daily.     No current facility-administered medications for this visit.     Allergies:   Patient has no known allergies.   Social History:  The patient  reports that he has never smoked. He has never used smokeless tobacco. He reports current alcohol use of about 7.0 standard drinks of alcohol per week. He reports that he does not use drugs.   Family History:  The patient's family history includes Colon cancer in his father and maternal grandmother.   ROS:  Please see the history of present illness.   All other systems are personally reviewed and negative.    Exam:    Vital Signs:  BP 120/70   Pulse 68   Ht 5\' 10"  (1.778 m)   Wt 225 lb (102.1 kg)   BMI 32.28 kg/m   Well sounding and appearing, alert and conversant, regular work of breathing,  good skin color Eyes- anicteric, neuro- grossly intact, skin- no apparent rash or lesions or cyanosis, mouth- oral mucosa is pink  Labs/Other Tests and Data Reviewed:    Recent Labs: No results found for requested labs within last 8760 hours.   Wt Readings from Last 3 Encounters:  10/02/18 225 lb (102.1 kg)  09/22/17 236 lb 12.8 oz (107.4 kg)  08/08/17 235 lb (106.6 kg)  ASSESSMENT & PLAN:    1.  Afib/ nonsustained atach No symptoms over the past year chads2vasc score is 1.  Does not require Carrick therapy  2. NSVT No ischemic symptoms No symptoms of arrhythmia  3. Overweight Body mass index is 32.28 kg/m. He has lost 10 lbs over the past year!  He is working diligently to stay active.  4. OSA Compliant with CPAP  Follow-up:  Return in a tear   Patient Risk:  after full review of this patients clinical status, I feel that they are at moderate risk at this time.  Today, I have spent 15 minutes with the patient with telehealth technology discussing arrhythmia management .    Army Fossa, MD  10/02/2018  9:29 AM     [place]  [address] [phone] (office) [phone] (fax)

## 2018-11-21 DIAGNOSIS — Z23 Encounter for immunization: Secondary | ICD-10-CM | POA: Diagnosis not present

## 2018-11-23 DIAGNOSIS — S61412A Laceration without foreign body of left hand, initial encounter: Secondary | ICD-10-CM | POA: Diagnosis not present

## 2018-12-19 DIAGNOSIS — H3589 Other specified retinal disorders: Secondary | ICD-10-CM | POA: Diagnosis not present

## 2018-12-19 DIAGNOSIS — H35423 Microcystoid degeneration of retina, bilateral: Secondary | ICD-10-CM | POA: Diagnosis not present

## 2018-12-19 DIAGNOSIS — H35371 Puckering of macula, right eye: Secondary | ICD-10-CM | POA: Diagnosis not present

## 2018-12-19 DIAGNOSIS — H43813 Vitreous degeneration, bilateral: Secondary | ICD-10-CM | POA: Diagnosis not present

## 2019-02-06 DIAGNOSIS — H35371 Puckering of macula, right eye: Secondary | ICD-10-CM | POA: Diagnosis not present

## 2019-02-06 DIAGNOSIS — H35423 Microcystoid degeneration of retina, bilateral: Secondary | ICD-10-CM | POA: Diagnosis not present

## 2019-02-06 DIAGNOSIS — H3589 Other specified retinal disorders: Secondary | ICD-10-CM | POA: Diagnosis not present

## 2019-02-06 DIAGNOSIS — H43813 Vitreous degeneration, bilateral: Secondary | ICD-10-CM | POA: Diagnosis not present

## 2019-03-06 DIAGNOSIS — H2513 Age-related nuclear cataract, bilateral: Secondary | ICD-10-CM | POA: Diagnosis not present

## 2019-03-06 DIAGNOSIS — H40053 Ocular hypertension, bilateral: Secondary | ICD-10-CM | POA: Diagnosis not present

## 2019-03-06 DIAGNOSIS — H1711 Central corneal opacity, right eye: Secondary | ICD-10-CM | POA: Diagnosis not present

## 2019-03-06 DIAGNOSIS — H5213 Myopia, bilateral: Secondary | ICD-10-CM | POA: Diagnosis not present

## 2019-03-06 DIAGNOSIS — H40013 Open angle with borderline findings, low risk, bilateral: Secondary | ICD-10-CM | POA: Diagnosis not present

## 2019-03-06 DIAGNOSIS — H35371 Puckering of macula, right eye: Secondary | ICD-10-CM | POA: Diagnosis not present

## 2019-05-02 DIAGNOSIS — E7800 Pure hypercholesterolemia, unspecified: Secondary | ICD-10-CM | POA: Diagnosis not present

## 2019-05-02 DIAGNOSIS — Z125 Encounter for screening for malignant neoplasm of prostate: Secondary | ICD-10-CM | POA: Diagnosis not present

## 2019-10-08 ENCOUNTER — Ambulatory Visit: Payer: PRIVATE HEALTH INSURANCE | Admitting: Internal Medicine

## 2020-02-06 ENCOUNTER — Other Ambulatory Visit: Payer: Self-pay | Admitting: Orthopaedic Surgery

## 2020-02-06 DIAGNOSIS — Z01818 Encounter for other preprocedural examination: Secondary | ICD-10-CM

## 2020-02-08 ENCOUNTER — Telehealth: Payer: Self-pay | Admitting: Internal Medicine

## 2020-02-08 NOTE — Telephone Encounter (Signed)
Called the requesting office and left a detailed voice message for Wells Guiles about patient needing an appointment for cardiac clearance and patient is schedule to see Tommye Standard, PA-C on 02/13/20 at 11:15 AM. I asked that she give the office a call if she has any questions.

## 2020-02-08 NOTE — Telephone Encounter (Signed)
   Primary Cardiologist: Dr. Rayann Heman  Chart reviewed as part of pre-operative protocol coverage. 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  Pre-op covering staff: - Please schedule appointment and call patient to inform them. Please add "pre-op clearance" to the appointment notes so provider is aware. - Please contact requesting surgeon's office via preferred method (i.e, phone, fax) to inform them of need for appointment prior to surgery.   Abigail Butts, PA-C  02/08/2020, 2:59 PM

## 2020-02-08 NOTE — Telephone Encounter (Signed)
Called patient [name] and explained reason for my call. Patient is scheduled to see Tommye Standard, PA-C on 02/13/20 at 11:15 AM.

## 2020-02-08 NOTE — Telephone Encounter (Signed)
   [facility] Pre-operative Risk Assessment    HEARTCARE STAFF: - Please ensure there is not already an duplicate clearance open for this procedure. - Under Visit Info/Reason for Call, type in Other and utilize the format Clearance MM/DD/YY or Clearance TBD. Do not use dashes or single digits. - If request is for dental extraction, please clarify the # of teeth to be extracted.  Request for surgical clearance:  1. What type of surgery is being performed? Right hip arthroplasty   2. When is this surgery scheduled? 03/04/20  3. What type of clearance is required (medical clearance vs. Pharmacy clearance to hold med vs. Both)? Both   4. Are there any medications that need to be held prior to surgery and how long? TBD by our office   5. Practice name and name of physician performing surgery? [facility], Dr. Melrose Nakayama   6. What is the office phone number? [phone]   7.   What is the office fax number? [phone]  8.   Anesthesia type (None, local, MAC, general) ? Spinal    Trilby Drummer 02/08/2020, 11:09 AM  _________________________________________________________________   (provider comments below)

## 2020-02-10 NOTE — Progress Notes (Unsigned)
Cardiology Office Note Date:  02/10/2020  Patient ID:  [name], [name], DOB [birth year], MRN [id number] PCP:  [name], [name], [name]  Electrophysiologist: Dr. Rayann Heman  ***refresh   Chief Complaint: *** pre-op clearance  History of Present Illness: [name], [name] is a 70 y.o. male with history of depression, HLD, OSA w/CPAP, Afib/Atach, NSVT.  He comes in today to be seen for Dr. Rayann Heman, last seen by him via tele health visit Sept 2020.  Mentioned doing yard work without symptoms.  No changes were made.  Not on a/c 2/2 low risk score.  Pending R hip arthoplasty, spinal anesthesia planned  *** symptoms, AF/AT otherwise *** meds *** labs, lipids *** score *** CPAP compliance *** no labs in epic or care everywhere, ? KPN  RCRI score: 0.4%  AFib/Atach Hx Diagnosed 2019  AAD Hx None to date   Past Medical History:  Diagnosis Date  . Adenomatous polyp of colon   . Contact lens/glasses fitting    wears contacts or glasses  . Depression   . Diverticulitis of colon 06/2010   recurrent  . Hyperlipidemia   . Obstructive sleep apnea    uses CPAP, followed by Dr Claiborne Billings  . Wears hearing aid    both ears    Past Surgical History:  Procedure Laterality Date  . CERVICAL LAMINECTOMY  1992  . COLONOSCOPY    . KNEE ARTHROSCOPY  2012   bilateral  . LESION REMOVAL N/A 02/06/2013   Procedure: MINOR EXICISION OF SCALP LESION;  Surgeon: [name], [name];  Location: [place];  Service: General;  Laterality: N/A;  . [place] &1977  . ROTATOR CUFF REPAIR  2008   left  . SHOULDER ARTHROSCOPY  2008   right    Current Outpatient Medications  Medication Sig Dispense Refill  . TRINTELLIX 20 MG TABS tablet 1 tablet daily.     No current facility-administered medications for this visit.    Allergies:   Patient has no known allergies.   Social History:  The patient  reports that he has never smoked. He has never used smokeless tobacco. He  reports current alcohol use of about 7.0 standard drinks of alcohol per week. He reports that he does not use drugs.   Family History:  The patient's family history includes Colon cancer in his father and maternal grandmother.  ROS:  Please see the history of present illness.    All other systems are reviewed and otherwise negative.   PHYSICAL EXAM:  VS:  There were no vitals taken for this visit. BMI: There is no height or weight on file to calculate BMI. Well nourished, well developed, in no acute distress HEENT: normocephalic, atraumatic Neck: no JVD, carotid bruits or masses Cardiac:  *** RRR; no significant murmurs, no rubs, or gallops Lungs:  *** CTA b/l, no wheezing, rhonchi or rales Abd: soft, nontender MS: no deformity or *** atrophy Ext: *** no edema Skin: warm and dry, no rash Neuro:  No gross deficits appreciated Psych: euthymic mood, full affect   EKG:  Done today and reviewed by myself shows  ***  Aug 2019: monitoring Sinus rhythm Single episode of nonsustained ventricular tachycardia (6 beats) Frequent nonsustained ectopic atrial tachycardia Atrial fibrillation noted 08/19/17 at 3:25 am   08/11/2017: TTE Study Conclusions  - Left ventricle: The cavity size was normal. Systolic function was  normal. The estimated ejection fraction was in the range of 55%  to 60%. Wall motion  was normal; there were no regional wall  motion abnormalities.  - Aortic valve: Transvalvular velocity was within the normal range.  There was no stenosis. There was no regurgitation.  - Mitral valve: Transvalvular velocity was within the normal range.  There was no evidence for stenosis. There was no regurgitation.  - Right ventricle: The cavity size was normal. Wall thickness was  normal. Systolic function was normal.  - Atrial septum: No defect or patent foramen ovale was identified.  - Tricuspid valve: There was no regurgitation.  - Pericardium, extracardiac: A trivial  pericardial effusion was  identified.      Recent Labs: No results found for requested labs within last 8760 hours.  No results found for requested labs within last 8760 hours.   CrCl cannot be calculated (No successful lab value found.).   Wt Readings from Last 3 Encounters:  10/02/18 225 lb (102.1 kg)  09/22/17 236 lb 12.8 oz (107.4 kg)  08/08/17 235 lb (106.6 kg)     Other studies reviewed: Additional studies/records reviewed today include: summarized above  ASSESSMENT AND PLAN:  1. Paroxysmal Afib 2. ATach     CHA2DS2Vasc is one for age     ***  3. OSA      ***  4. NSVT     preserved LVEF on his last echo, no WMA     *** no ischemic symptoms  5. Pre-op     Hip surgery     ***  Disposition: F/u with ***  Current medicines are reviewed at length with the patient today.  The patient did not have any concerns regarding medicines.  Venetia Night, PA-C 02/10/2020 6:32 PM     [place]  [address] [phone] (office)  [phone] (fax)

## 2020-02-13 ENCOUNTER — Ambulatory Visit: Payer: Self-pay | Admitting: Physician Assistant

## 2020-02-19 ENCOUNTER — Encounter: Payer: Self-pay | Admitting: Nurse Practitioner

## 2020-02-19 ENCOUNTER — Other Ambulatory Visit: Payer: Self-pay

## 2020-02-19 ENCOUNTER — Ambulatory Visit (INDEPENDENT_AMBULATORY_CARE_PROVIDER_SITE_OTHER): Payer: Medicare Other | Admitting: Nurse Practitioner

## 2020-02-19 VITALS — BP 132/76 | HR 66 | Ht 70.0 in | Wt 232.0 lb

## 2020-02-19 DIAGNOSIS — Z01818 Encounter for other preprocedural examination: Secondary | ICD-10-CM | POA: Diagnosis not present

## 2020-02-19 DIAGNOSIS — I480 Paroxysmal atrial fibrillation: Secondary | ICD-10-CM

## 2020-02-19 NOTE — Progress Notes (Signed)
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  He presents today for routine electrophysiology followup.  Since last being seen in our clinic, the patient reports doing very well.  He denies chest pain, palpitations, dyspnea, PND, orthopnea, nausea, vomiting, dizziness, syncope, edema, weight gain, or early satiety.  Past Medical History:  Diagnosis Date  . Adenomatous polyp of colon   . Contact lens/glasses fitting    wears contacts or glasses  . Depression   . Diverticulitis of colon 06/2010   recurrent  . Hyperlipidemia   . Obstructive sleep apnea    uses CPAP, followed by Dr Claiborne Billings  . Wears hearing aid    both ears   Past Surgical History:  Procedure Laterality Date  . CERVICAL LAMINECTOMY  1992  . COLONOSCOPY    . KNEE ARTHROSCOPY  2012   bilateral  . LESION REMOVAL N/A 02/06/2013   Procedure: MINOR EXICISION OF SCALP LESION;  Surgeon: Shann Medal, MD;  Location: [place];  Service: General;  Laterality: N/A;  . [place] &1977  . ROTATOR CUFF REPAIR  2008   left  . SHOULDER ARTHROSCOPY  2008   right    Current Outpatient Medications  Medication Sig Dispense Refill  . DHA-Vitamin C-Lutein (EYE HEALTH FORMULA PO) Take 1 tablet by mouth daily.    . Multiple Vitamins-Minerals (MULTIVITAMIN WITH MINERALS) tablet Take 1 tablet by mouth daily.    . TRINTELLIX 20 MG TABS tablet Take 20 mg by mouth daily.     No current facility-administered medications for this visit.    Allergies:   Patient has no known allergies.   Social History: Social History   Socioeconomic History  . Marital status: Married    Spouse name: Not on file  . Number of children: Not on file  . Years of education: Not on file  . Highest education level: Not  on file  Occupational History  . Occupation: Radiologist    Employer: [employer] Radiologist - [employer]  Tobacco Use  . Smoking status: Never Smoker  . Smokeless tobacco: Never Used  Substance and Sexual Activity  . Alcohol use: Yes    Alcohol/week: 7.0 standard drinks    Types: 7 Glasses of wine per week  . Drug use: No  . Sexual activity: Not on file  Other Topics Concern  . Not on file  Social History Narrative   Lives with spouse in [place].  Radiologist for [employer]   Social Determinants of Health   Financial Resource Strain: Not on file  Food Insecurity: Not on file  Transportation Needs: Not on file  Physical Activity: Not on file  Stress: Not on file  Social Connections: Not on file  Intimate Partner Violence: Not on file    Family History: Family History  Problem Relation Age of Onset  . Colon cancer Father   . Colon cancer Maternal Grandmother     Review of Systems: All other systems reviewed and are otherwise negative except as noted above.   Physical Exam: VS:  BP 132/76   Pulse 66   Ht 5\' 10"  (1.778 m)   Wt 232 lb (105.2 kg)   SpO2 96%   BMI 33.29 kg/m  , BMI Body mass  index is 33.29 kg/m. Wt Readings from Last 3 Encounters:  02/19/20 232 lb (105.2 kg)  10/02/18 225 lb (102.1 kg)  09/22/17 236 lb 12.8 oz (107.4 kg)    GEN- The patient is well appearing, alert and oriented x 3 today.   HEENT: normocephalic, atraumatic; sclera clear, conjunctiva pink; hearing intact; oropharynx clear; neck supple, no JVP Lymph- no cervical lymphadenopathy Lungs- Clear to ausculation bilaterally, normal work of breathing.  No wheezes, rales, rhonchi Heart- Regular rate and rhythm, no murmurs, rubs or gallops, PMI not laterally displaced GI- soft, non-tender, non-distended, bowel sounds present, no hepatosplenomegaly Extremities- no clubbing, cyanosis, or edema; DP/PT/radial pulses 2+ bilaterally MS- no significant deformity or  atrophy Skin- warm and dry, no rash or lesion  Psych- euthymic mood, full affect Neuro- strength and sensation are intact   EKG:  EKG is ordered today. The ekg ordered today shows sinus rhythm, rate 66  Recent Labs: No results found for requested labs within last 8760 hours.    Other studies Reviewed: Additional studies/ records that were reviewed today include: Dr Jackalyn Lombard office notes  Assessment and Plan:  1.  Paroxysmal atrial fibrillation/ nonsustained AT No recent significant symptoms CHADS2VASC is 1 - does not meet guidelines for [facility] therapy  2.  Surgical clearance He remains active walking up to 2 miles without symptoms of chest pain or shortness of breath Echo 2019 with normal LVEF RCRI score for surgery is 0.4% risk for major cardiac events Functional capacity is 8.97METS He does not need further cardiac testing prior to hip surgery.      Current medicines are reviewed at length with the patient today.   The patient does not have concerns regarding his medicines.  The following changes were made today:  none  Labs/ tests ordered today include:  Orders Placed This Encounter  Procedures  . EKG 12-Lead     Disposition:   Follow up with Dr Rayann Heman 1 year      Signed, Chanetta Marshall, NP 02/19/2020 9:06 AM   [place] [address] [place] [address] [phone] (office) [phone] (fax)

## 2020-02-19 NOTE — Patient Instructions (Signed)
Medication Instructions:  Your physician recommends that you continue on your current medications as directed. Please refer to the Current Medication list given to you today.  *If you need a refill on your cardiac medications before your next appointment, please call your pharmacy*   Lab Work: None If you have labs (blood work) drawn today and your tests are completely normal, you will receive your results only by: Marland Kitchen MyChart Message (if you have MyChart) OR . A paper copy in the mail If you have any lab test that is abnormal or we need to change your treatment, we will call you to review the results.   Follow-Up: At [facility], you and your health needs are our priority.  As part of our continuing mission to provide you with exceptional heart care, we have created designated Provider Care Teams.  These Care Teams include your primary Cardiologist (physician) and Advanced Practice Providers (APPs -  Physician Assistants and Nurse Practitioners) who all work together to provide you with the care you need, when you need it.  Your next appointment:   1 year(s)  The format for your next appointment:   In Person  Provider:   You may see Thompson Grayer, MD or one of the following Advanced Practice Providers on your designated Care Team:    Chanetta Marshall, NP

## 2020-02-25 NOTE — Patient Instructions (Addendum)
DUE TO COVID-19 ONLY ONE VISITOR IS ALLOWED TO COME WITH YOU AND STAY IN THE WAITING ROOM ONLY DURING PRE OP AND PROCEDURE DAY OF SURGERY. THE 1 VISITOR  MAY VISIT WITH YOU AFTER SURGERY IN YOUR PRIVATE ROOM DURING VISITING HOURS ONLY!  YOU NEED TO HAVE A COVID 19 TEST ON: 02/29/20 @ 12:30 PM  , THIS TEST MUST BE DONE BEFORE SURGERY,  COVID TESTING SITE [place] [address], IT IS ON THE RIGHT GOING OUT [address] APPROXIMATELY  2 MINUTES PAST [employer] ON THE RIGHT. ONCE YOUR COVID TEST IS COMPLETED,  PLEASE BEGIN THE QUARANTINE INSTRUCTIONS AS OUTLINED IN YOUR HANDOUT.                Tyrone Seat, [name]    Your procedure is scheduled on: 03/04/20   Report to [facility] Main  Entrance   Report to short stay at: 5:30 AM     Call this number if you have problems the morning of surgery [phone]    Remember:    NO SOLID FOOD AFTER MIDNIGHT THE NIGHT PRIOR TO SURGERY. NOTHING BY MOUTH EXCEPT CLEAR LIQUIDS UNTIL: 4:30 AM . PLEASE FINISH ENSURE DRINK PER SURGEON ORDER  WHICH NEEDS TO BE COMPLETED AT: 4:30 AM .  CLEAR LIQUID DIET  Foods Allowed                                                                     Foods Excluded  Coffee and tea, regular and decaf                             liquids that you cannot  Plain Jell-O any favor except red or purple                                           see through such as: Fruit ices (not with fruit pulp)                                     milk, soups, orange juice  Iced Popsicles                                    All solid food Carbonated beverages, regular and diet                                    Cranberry, grape and apple juices Sports drinks like Gatorade Lightly seasoned clear broth or consume(fat free) Sugar, honey syrup  Sample Menu Breakfast                                Lunch  Supper Cranberry juice                    Beef broth                             Chicken broth Jell-O                                     Grape juice                           Apple juice Coffee or tea                        Jell-O                                      Popsicle                                                Coffee or tea                        Coffee or tea  _____________________________________________________________________   BRUSH YOUR TEETH MORNING OF SURGERY AND RINSE YOUR MOUTH OUT, NO CHEWING GUM CANDY OR MINTS.               You may not have any metal on your body including hair pins and              piercings  Do not wear jewelry, lotions, powders or perfumes, deodorant             Men may shave face and neck.   Do not bring valuables to the hospital. [place].  Contacts, dentures or bridgework may not be worn into surgery.  Leave suitcase in the car. After surgery it may be brought to your room.     Patients discharged the day of surgery will not be allowed to drive home. IF YOU ARE HAVING SURGERY AND GOING HOME THE SAME DAY, YOU MUST HAVE AN ADULT TO DRIVE YOU HOME AND BE WITH YOU FOR 24 HOURS. YOU MAY GO HOME BY TAXI OR UBER OR ORTHERWISE, BUT AN ADULT MUST ACCOMPANY YOU HOME AND STAY WITH YOU FOR 24 HOURS.  Name and phone number of your driver:  Special Instructions: N/A              Please read over the following fact sheets you were given: _____________________________________________________________________    PLEASE BRING CPAP MASK [place]. DEVICE WILL BE PROVIDED!        [place] - Preparing for Surgery Before surgery, you can play an important role.  Because skin is not sterile, your skin needs to be as free of germs as possible.  You can reduce the number of germs on your skin by washing with CHG (chlorahexidine gluconate) soap before surgery.  CHG is an antiseptic cleaner which kills germs and bonds with the skin to  continue killing germs even after  washing. Please DO NOT use if you have an allergy to CHG or antibacterial soaps.  If your skin becomes reddened/irritated stop using the CHG and inform your nurse when you arrive at Short Stay. Do not shave (including legs and underarms) for at least 48 hours prior to the first CHG shower.  You may shave your face/neck. Please follow these instructions carefully:  1.  Shower with CHG Soap the night before surgery and the  morning of Surgery.  2.  If you choose to wash your hair, wash your hair first as usual with your  normal  shampoo.  3.  After you shampoo, rinse your hair and body thoroughly to remove the  shampoo.                           4.  Use CHG as you would any other liquid soap.  You can apply chg directly  to the skin and wash                       Gently with a scrungie or clean washcloth.  5.  Apply the CHG Soap to your body ONLY FROM THE NECK DOWN.   Do not use on face/ open                           Wound or open sores. Avoid contact with eyes, ears mouth and genitals (private parts).                       Wash face,  Genitals (private parts) with your normal soap.             6.  Wash thoroughly, paying special attention to the area where your surgery  will be performed.  7.  Thoroughly rinse your body with warm water from the neck down.  8.  DO NOT shower/wash with your normal soap after using and rinsing off  the CHG Soap.                9.  Pat yourself dry with a clean towel.            10.  Wear clean pajamas.            11.  Place clean sheets on your bed the night of your first shower and do not  sleep with pets. Day of Surgery : Do not apply any lotions/deodorants the morning of surgery.  Please wear clean clothes to the hospital/surgery center.  FAILURE TO FOLLOW THESE INSTRUCTIONS MAY RESULT IN THE CANCELLATION OF YOUR SURGERY PATIENT SIGNATURE_________________________________  NURSE  SIGNATURE__________________________________  ________________________________________________________________________  [name]  An incentive spirometer is a tool that can help keep your lungs clear and active. This tool measures how well you are filling your lungs with each breath. Taking long deep breaths may help reverse or decrease the chance of developing breathing (pulmonary) problems (especially infection) following:  A long period of time when you are unable to move or be active. BEFORE THE PROCEDURE   If the spirometer includes an indicator to show your best effort, your nurse or respiratory therapist will set it to a desired goal.  If possible, sit up straight or lean slightly forward. Try not to slouch.  Hold the incentive spirometer in an upright position. INSTRUCTIONS FOR USE  1. Sit  on the edge of your bed if possible, or sit up as far as you can in bed or on a chair. 2. Hold the incentive spirometer in an upright position. 3. Breathe out normally. 4. Place the mouthpiece in your mouth and seal your lips tightly around it. 5. Breathe in slowly and as deeply as possible, raising the piston or the ball toward the top of the column. 6. Hold your breath for 3-5 seconds or for as long as possible. Allow the piston or ball to fall to the bottom of the column. 7. Remove the mouthpiece from your mouth and breathe out normally. 8. Rest for a few seconds and repeat Steps 1 through 7 at least 10 times every 1-2 hours when you are awake. Take your time and take a few normal breaths between deep breaths. 9. The spirometer may include an indicator to show your best effort. Use the indicator as a goal to work toward during each repetition. 10. After each set of 10 deep breaths, practice coughing to be sure your lungs are clear. If you have an incision (the cut made at the time of surgery), support your incision when coughing by placing a pillow or rolled up towels firmly against  it. Once you are able to get out of bed, walk around indoors and cough well. You may stop using the incentive spirometer when instructed by your caregiver.  RISKS AND COMPLICATIONS  Take your time so you do not get dizzy or light-headed.  If you are in pain, you may need to take or ask for pain medication before doing incentive spirometry. It is harder to take a deep breath if you are having pain. AFTER USE  Rest and breathe slowly and easily.  It can be helpful to keep track of a log of your progress. Your caregiver can provide you with a simple table to help with this. If you are using the spirometer at home, follow these instructions: [place] IF:   You are having difficultly using the spirometer.  You have trouble using the spirometer as often as instructed.  Your pain medication is not giving enough relief while using the spirometer.  You develop fever of 100.5 F (38.1 C) or higher. SEEK IMMEDIATE MEDICAL CARE IF:   You cough up bloody sputum that had not been present before.  You develop fever of 102 F (38.9 C) or greater.  You develop worsening pain at or near the incision site. MAKE SURE YOU:   Understand these instructions.  Will watch your condition.  Will get help right away if you are not doing well or get worse. Document Released: 05/03/2006 Document Revised: 03/15/2011 Document Reviewed: 07/04/2006 [facility] Patient Information 2014 [place], [place].   ________________________________________________________________________

## 2020-02-26 ENCOUNTER — Other Ambulatory Visit: Payer: Self-pay

## 2020-02-26 ENCOUNTER — Encounter (HOSPITAL_COMMUNITY): Payer: Self-pay

## 2020-02-26 ENCOUNTER — Ambulatory Visit (HOSPITAL_COMMUNITY)
Admission: RE | Admit: 2020-02-26 | Discharge: 2020-02-26 | Disposition: A | Discharge status: Home / Self Care | Payer: Medicare Other | Source: Ambulatory Visit | Attending: Orthopaedic Surgery | Admitting: Orthopaedic Surgery

## 2020-02-26 ENCOUNTER — Encounter (HOSPITAL_COMMUNITY)
Admission: RE | Admit: 2020-02-26 | Discharge: 2020-02-26 | Disposition: A | Discharge status: Home / Self Care | Payer: Medicare Other | Source: Ambulatory Visit | Attending: Orthopaedic Surgery | Admitting: Orthopaedic Surgery

## 2020-02-26 DIAGNOSIS — Z01818 Encounter for other preprocedural examination: Secondary | ICD-10-CM | POA: Insufficient documentation

## 2020-02-26 DIAGNOSIS — Z79899 Other long term (current) drug therapy: Secondary | ICD-10-CM | POA: Diagnosis not present

## 2020-02-26 DIAGNOSIS — G4733 Obstructive sleep apnea (adult) (pediatric): Secondary | ICD-10-CM | POA: Insufficient documentation

## 2020-02-26 DIAGNOSIS — I4891 Unspecified atrial fibrillation: Secondary | ICD-10-CM | POA: Diagnosis not present

## 2020-02-26 DIAGNOSIS — M1611 Unilateral primary osteoarthritis, right hip: Secondary | ICD-10-CM | POA: Diagnosis not present

## 2020-02-26 HISTORY — DX: Cardiac arrhythmia, unspecified: I49.9

## 2020-02-26 HISTORY — DX: Unspecified osteoarthritis, unspecified site: M19.90

## 2020-02-26 LAB — CBC WITH DIFFERENTIAL/PLATELET
Abs Immature Granulocytes: 0.03 10*3/uL (ref 0.00–0.07)
Basophils Absolute: 0 10*3/uL (ref 0.0–0.1)
Basophils Relative: 1 %
Eosinophils Absolute: 0.1 10*3/uL (ref 0.0–0.5)
Eosinophils Relative: 2 %
HCT: 46.4 % (ref 39.0–52.0)
Hemoglobin: 15.6 g/dL (ref 13.0–17.0)
Immature Granulocytes: 1 %
Lymphocytes Relative: 25 %
Lymphs Abs: 1.3 10*3/uL (ref 0.7–4.0)
MCH: 32.3 pg (ref 26.0–34.0)
MCHC: 33.6 g/dL (ref 30.0–36.0)
MCV: 96.1 fL (ref 80.0–100.0)
Monocytes Absolute: 0.4 10*3/uL (ref 0.1–1.0)
Monocytes Relative: 8 %
Neutro Abs: 3.4 10*3/uL (ref 1.7–7.7)
Neutrophils Relative %: 63 %
Platelets: 246 10*3/uL (ref 150–400)
RBC: 4.83 MIL/uL (ref 4.22–5.81)
RDW: 14 % (ref 11.5–15.5)
WBC: 5.3 10*3/uL (ref 4.0–10.5)
nRBC: 0 % (ref 0.0–0.2)

## 2020-02-26 LAB — BASIC METABOLIC PANEL
Anion gap: 10 (ref 5–15)
BUN: 17 mg/dL (ref 8–23)
CO2: 21 mmol/L — ABNORMAL LOW (ref 22–32)
Calcium: 9 mg/dL (ref 8.9–10.3)
Chloride: 108 mmol/L (ref 98–111)
Creatinine, Ser: 0.88 mg/dL (ref 0.61–1.24)
GFR, Estimated: >60 mL/min (ref >60)
Glucose, Bld: 92 mg/dL (ref 70–99)
Potassium: 4.1 mmol/L (ref 3.5–5.1)
Sodium: 139 mmol/L (ref 135–145)

## 2020-02-26 LAB — URINALYSIS, ROUTINE W REFLEX MICROSCOPIC
Bilirubin Urine: NEGATIVE
Glucose, UA: NEGATIVE mg/dL
Hgb urine dipstick: NEGATIVE
Ketones, ur: NEGATIVE mg/dL
Leukocytes,Ua: NEGATIVE
Nitrite: NEGATIVE
Protein, ur: NEGATIVE mg/dL
Specific Gravity, Urine: 1.006 (ref 1.005–1.030)
pH: 7 (ref 5.0–8.0)

## 2020-02-26 LAB — SURGICAL PCR SCREEN
MRSA, PCR: NEGATIVE
Staphylococcus aureus: NEGATIVE

## 2020-02-26 LAB — APTT: aPTT: 29 seconds (ref 24–36)

## 2020-02-26 LAB — PROTIME-INR
INR: 1.1 (ref 0.8–1.2)
Prothrombin Time: 13.6 seconds (ref 11.4–15.2)

## 2020-02-26 NOTE — Progress Notes (Signed)
COVID Vaccine Completed:Yes Date COVID Vaccine completed: 02/2020 [place] COVID vaccine manufacturer:     Moderna    PCP - Dr. Danella Sensing Cardiologist - Dr. Thompson Grayer.Clearance: Chanetta Marshall: NP: 02/19/20.:EPIC  Chest x-ray -  EKG - 02/19/20 Stress Test -  ECHO - 08/12/18 Cardiac Cath -  Pacemaker/ICD device last checked:  Sleep Study - YES CPAP - YES  Fasting Blood Sugar -  Checks Blood Sugar _____ times a day  Blood Thinner Instructions: Aspirin Instructions: Last Dose:  Anesthesia review: Hx: OSA(CPAP),paroxysmal Afib.  Patient denies shortness of breath, fever, cough and chest pain at PAT appointment   Patient verbalized understanding of instructions that were given to them at the PAT appointment. Patient was also instructed that they will need to review over the PAT instructions again at home before surgery.

## 2020-02-27 NOTE — Anesthesia Preprocedure Evaluation (Addendum)
Anesthesia Evaluation  Patient identified by MRN, date of birth, ID band Patient awake    Reviewed: Allergy & Precautions, NPO status , Patient's Chart, lab work & pertinent test results  Airway Mallampati: III  TM Distance: >3 FB Neck ROM: Full    Dental  (+) Teeth Intact, Dental Advisory Given   Pulmonary sleep apnea and Continuous Positive Airway Pressure Ventilation ,    breath sounds clear to auscultation       Cardiovascular + dysrhythmias Atrial Fibrillation  Rhythm:Regular Rate:Normal     Neuro/Psych PSYCHIATRIC DISORDERS Depression    GI/Hepatic negative GI ROS, Neg liver ROS,   Endo/Other  negative endocrine ROS  Renal/GU negative Renal ROS     Musculoskeletal  (+) Arthritis ,   Abdominal Normal abdominal exam  (+)   Peds  Hematology negative hematology ROS (+)   Anesthesia Other Findings   Reproductive/Obstetrics                            Anesthesia Physical Anesthesia Plan  ASA: III  Anesthesia Plan: Spinal   Post-op Pain Management:    Induction: Intravenous  PONV Risk Score and Plan: 3 and Ondansetron, Midazolam and Propofol infusion  Airway Management Planned: Natural Airway and Simple Face Mask  Additional Equipment: None  Intra-op Plan:   Post-operative Plan:   Informed Consent: I have reviewed the patients History and Physical, chart, labs and discussed the procedure including the risks, benefits and alternatives for the proposed anesthesia with the patient or authorized representative who has indicated his/her understanding and acceptance.       Plan Discussed with: CRNA  Anesthesia Plan Comments: (See PAT note 02/26/20, Konrad Felix, PA-C  Lab Results      Component                Value               Date                      WBC                      5.3                 02/26/2020                HGB                      15.6                 02/26/2020                HCT                      46.4                02/26/2020                MCV                      96.1                02/26/2020                PLT                      246  02/26/2020           )      Anesthesia Quick Evaluation

## 2020-02-27 NOTE — Progress Notes (Signed)
Anesthesia Chart Review   Case: [id number] Date/Time: 03/04/20 0715   Procedure: RIGHT TOTAL HIP ARTHROPLASTY ANTERIOR APPROACH (Right Hip)   Anesthesia type: Spinal   Pre-op diagnosis: RIGHT HIP DEGENERATIVE JOINT DISEASE   Location: [facility] ROOM 06 / [facility]   Surgeons: Melrose Nakayama, MD      DISCUSSION:69 y.o. never smoker with h/o OSA, A-fib (no anticoagulation), right hip djd scheduled for above procedure 03/04/20 with Dr. Melrose Nakayama.   Pt last seen by cardiology 02/19/2020 for preoperative evaluation.  Per OV note, "He remains active walking up to 2 miles without symptoms of chest pain or shortness of breath Echo 2019 with normal LVEF RCRI score for surgery is 0.4% risk for major cardiac events Functional capacity is 8.97METS He does not need further cardiac testing prior to hip surgery."  Anticipate pt can proceed with planned procedure barring acute status change.   VS: BP 131/71   Pulse 69   Temp 36.6 C (Oral)   Ht 5\' 10"  (1.778 m)   Wt 104.3 kg   SpO2 97%   BMI 33.00 kg/m   PROVIDERS: Mitchell, L.[name], MD is PCP   Thompson Grayer, MD is Cardiologist  LABS: Labs reviewed: Acceptable for surgery. (all labs ordered are listed, but only abnormal results are displayed)  Labs Reviewed  BASIC METABOLIC PANEL - Abnormal; Notable for the following components:      Result Value   CO2 21 (*)    All other components within normal limits  URINALYSIS, ROUTINE W REFLEX MICROSCOPIC - Abnormal; Notable for the following components:   Color, Urine STRAW (*)    All other components within normal limits  SURGICAL PCR SCREEN  CBC WITH DIFFERENTIAL/PLATELET  PROTIME-INR  APTT  TYPE AND SCREEN     IMAGES:   EKG: 02/19/2020 Rate 66 bpm  Sinus rhythm   CV: Echo 08/11/2017 Study Conclusions   - Left ventricle: The cavity size was normal. Systolic function was  normal. The estimated ejection fraction was in the range of 55%  to 60%. Wall motion was normal; there were no  regional wall  motion abnormalities.  - Aortic valve: Transvalvular velocity was within the normal range.  There was no stenosis. There was no regurgitation.  - Mitral valve: Transvalvular velocity was within the normal range.  There was no evidence for stenosis. There was no regurgitation.  - Right ventricle: The cavity size was normal. Wall thickness was  normal. Systolic function was normal.  - Atrial septum: No defect or patent foramen ovale was identified.  - Tricuspid valve: There was no regurgitation.  - Pericardium, extracardiac: A trivial pericardial effusion was  identified.  Past Medical History:  Diagnosis Date  . Adenomatous polyp of colon   . Arthritis   . Contact lens/glasses fitting    wears contacts or glasses  . Depression   . Diverticulitis of colon 06/2010   recurrent  . Dysrhythmia    Afib  . Hyperlipidemia   . Obstructive sleep apnea    uses CPAP, followed by Dr Claiborne Billings  . Wears hearing aid    both ears    Past Surgical History:  Procedure Laterality Date  . CERVICAL LAMINECTOMY  1992  . COLONOSCOPY    . KNEE ARTHROSCOPY  2012   bilateral  . LESION REMOVAL N/A 02/06/2013   Procedure: MINOR EXICISION OF SCALP LESION;  Surgeon: Shann Medal, MD;  Location: [facility];  Service: General;  Laterality: N/A;  . [place]  2008   left  . SHOULDER ARTHROSCOPY  2008   right    MEDICATIONS: . DHA-Vitamin C-Lutein (EYE HEALTH FORMULA PO)  . Multiple Vitamins-Minerals (MULTIVITAMIN WITH MINERALS) tablet  . TRINTELLIX 20 MG TABS tablet   No current facility-administered medications for this encounter.    Konrad Felix, PA-C [facility] Pre-Surgical Testing [phone]

## 2020-02-29 ENCOUNTER — Other Ambulatory Visit (HOSPITAL_COMMUNITY)
Admission: RE | Admit: 2020-02-29 | Discharge: 2020-02-29 | Disposition: A | Discharge status: Home / Self Care | Payer: Medicare Other | Source: Ambulatory Visit | Attending: Orthopaedic Surgery | Admitting: Orthopaedic Surgery

## 2020-02-29 DIAGNOSIS — Z01812 Encounter for preprocedural laboratory examination: Secondary | ICD-10-CM | POA: Diagnosis present

## 2020-02-29 DIAGNOSIS — Z20822 Contact with and (suspected) exposure to covid-19: Secondary | ICD-10-CM | POA: Insufficient documentation

## 2020-02-29 LAB — SARS CORONAVIRUS 2 (TAT 6-24 HRS): SARS Coronavirus 2: NEGATIVE

## 2020-03-03 MED ORDER — BUPIVACAINE LIPOSOME 1.3 % IJ SUSP
10.0000 mL | Freq: Once | INTRAMUSCULAR | Status: DC
  Filled 2020-03-03: qty 10

## 2020-03-03 MED ORDER — TRANEXAMIC ACID 1000 MG/10ML IV SOLN
2000.0000 mg | INTRAVENOUS | Status: DC
  Filled 2020-03-03: qty 20

## 2020-03-03 NOTE — H&P (Signed)
TOTAL HIP ADMISSION H&P  Patient is admitted for right total hip arthroplasty.  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  Onset of symptoms was gradual starting 5 years ago with gradually worsening course since that time.The patient noted no past surgery on the right hip(s).  Patient currently rates pain in the right hip at 10 out of 10 with activity. Patient has night pain, worsening of pain with activity and weight bearing, trendelenberg gait, pain that interfers with activities of daily living and crepitus. Patient has evidence of subchondral cysts, subchondral sclerosis, periarticular osteophytes and joint space narrowing by imaging studies. This condition presents safety issues increasing the risk of falls.  There is no current active infection.  Patient Active Problem List   Diagnosis Date Noted  . Mass of scalp 4x3x3cm 11/20/2012  . Benign lipomatous neoplasm of skin, subcu of right arm 11/20/2012  . Benign lipomatous neoplasm of skin and subcutaneous tissue of left arm 11/20/2012  . Special screening for malignant neoplasms, colon 09/14/2010  . Diverticulosis of colon (without mention of hemorrhage) 09/14/2010   Past Medical History:  Diagnosis Date  . Adenomatous polyp of colon   . Arthritis   . Contact lens/glasses fitting    wears contacts or glasses  . Depression   . Diverticulitis of colon 06/2010   recurrent  . Dysrhythmia    Afib  . Hyperlipidemia   . Obstructive sleep apnea    uses CPAP, followed by Dr Claiborne Billings  . Wears hearing aid    both ears    Past Surgical History:  Procedure Laterality  Date  . CERVICAL LAMINECTOMY  1992  . COLONOSCOPY    . KNEE ARTHROSCOPY  2012   bilateral  . LESION REMOVAL N/A 02/06/2013   Procedure: MINOR EXICISION OF SCALP LESION;  Surgeon: Shann Medal, MD;  Location: [facility];  Service: General;  Laterality: N/A;  . [place] &1977  . ROTATOR CUFF REPAIR  2008   left  . SHOULDER ARTHROSCOPY  2008   right    Current Facility-Administered Medications  Medication Dose Route Frequency Provider Last Rate Last Admin  . [START ON 03/04/2020] bupivacaine liposome (EXPAREL) 1.3 % injection 133 mg  10 mL Other Once Melrose Nakayama, MD      . Derrill Memo ON 03/04/2020] tranexamic acid (CYKLOKAPRON) 2,000 mg in sodium chloride 0.9 % 50 mL Topical Application  2,637 mg Topical To OR Melrose Nakayama, MD       Current Outpatient Medications  Medication Sig Dispense Refill Last Dose  . DHA-Vitamin C-Lutein (EYE HEALTH FORMULA PO) Take 1 tablet by mouth daily.     . Multiple Vitamins-Minerals (MULTIVITAMIN WITH MINERALS) tablet Take 1 tablet by mouth daily.     . TRINTELLIX 20 MG TABS tablet Take 20 mg by mouth daily.      No Known Allergies  Social History   Tobacco Use  . Smoking status: Never Smoker  . Smokeless tobacco: Never Used  Substance Use Topics  . Alcohol use: Yes    Alcohol/week: 7.0 standard drinks    Types: 7 Glasses of wine per week    Comment: soc.  Family History  Problem Relation Age of Onset  . Colon cancer Father   . Colon cancer Maternal Grandmother      Review of Systems  Musculoskeletal: Positive for arthralgias.       Right hip  All other systems reviewed and are negative.   Objective:  Physical Exam Constitutional:      Appearance: Normal appearance.  HENT:     Head: Normocephalic and atraumatic.     Nose: Nose normal.     Mouth/Throat:     Pharynx: Oropharynx is clear.  Eyes:     Extraocular Movements: Extraocular movements intact.  Cardiovascular:     Rate and Rhythm: Normal  rate.  Pulmonary:     Effort: Pulmonary effort is normal.  Abdominal:     Palpations: Abdomen is soft.  Musculoskeletal:     Cervical back: Normal range of motion.     Comments: Right hip motion is extremely painful in internal rotation.  He has some stiffness as well.  Straight leg raise causes some mild back pain.  Right knee has no effusion with fairly good motion and no joint line pain.  There is some mild crepitation there.  He has normal unlabored respirations.  Sensation and motor function are intact in his feet with palpable pulses on both sides.    Skin:    General: Skin is warm and dry.  Neurological:     General: No focal deficit present.     Mental Status: He is alert and oriented to person, place, and time. Mental status is at baseline.  Psychiatric:        Mood and Affect: Mood normal.        Behavior: Behavior normal.        Thought Content: Thought content normal.        Judgment: Judgment normal.     Vital signs in last 24 hours:    Labs:   Estimated body mass index is 33 kg/m as calculated from the following:   Height as of 02/26/20: 5\' 10"  (1.778 m).   Weight as of 02/26/20: 104.3 kg.   Imaging Review Plain radiographs demonstrate severe degenerative joint disease of the right hip(s). The bone quality appears to be good for age and reported activity level.      Assessment/Plan:  End stage primary arthritis, right hip(s)  The patient history, physical examination, clinical judgement of the provider and imaging studies are consistent with end stage degenerative joint disease of the right hip(s) and total hip arthroplasty is deemed medically necessary. The treatment options including medical management, injection therapy, arthroscopy and arthroplasty were discussed at length. The risks and benefits of total hip arthroplasty were presented and reviewed. The risks due to aseptic loosening, infection, stiffness, dislocation/subluxation,  thromboembolic  complications and other imponderables were discussed.  The patient acknowledged the explanation, agreed to proceed with the plan and consent was signed. Patient is being admitted for inpatient treatment for surgery, pain control, PT, OT, prophylactic antibiotics, VTE prophylaxis, progressive ambulation and ADL's and discharge planning.The patient is planning to be discharged home with home health services

## 2020-03-04 ENCOUNTER — Ambulatory Visit (HOSPITAL_COMMUNITY)
Admission: RE | Admit: 2020-03-04 | Discharge: 2020-03-04 | Disposition: A | Discharge status: Home / Self Care | Payer: Medicare Other | Attending: Orthopaedic Surgery | Admitting: Orthopaedic Surgery

## 2020-03-04 ENCOUNTER — Encounter (HOSPITAL_COMMUNITY)
Admission: RE | Disposition: A | Discharge status: Home / Self Care | Payer: Self-pay | Source: Home / Self Care | Attending: Orthopaedic Surgery

## 2020-03-04 ENCOUNTER — Ambulatory Visit (HOSPITAL_COMMUNITY): Payer: Medicare Other | Admitting: Physician Assistant

## 2020-03-04 ENCOUNTER — Ambulatory Visit (HOSPITAL_COMMUNITY): Payer: Medicare Other

## 2020-03-04 ENCOUNTER — Ambulatory Visit (HOSPITAL_COMMUNITY): Payer: Medicare Other | Admitting: Anesthesiology

## 2020-03-04 ENCOUNTER — Encounter (HOSPITAL_COMMUNITY): Payer: Self-pay | Admitting: Orthopaedic Surgery

## 2020-03-04 DIAGNOSIS — Z79899 Other long term (current) drug therapy: Secondary | ICD-10-CM | POA: Diagnosis not present

## 2020-03-04 DIAGNOSIS — M1611 Unilateral primary osteoarthritis, right hip: Secondary | ICD-10-CM | POA: Diagnosis present

## 2020-03-04 DIAGNOSIS — Z419 Encounter for procedure for purposes other than remedying health state, unspecified: Secondary | ICD-10-CM

## 2020-03-04 HISTORY — PX: TOTAL HIP ARTHROPLASTY: SHX124

## 2020-03-04 LAB — TYPE AND SCREEN
ABO/RH(D): A POS
Antibody Screen: NEGATIVE

## 2020-03-04 LAB — ABO/RH: ABO/RH(D): A POS

## 2020-03-04 SURGERY — ARTHROPLASTY, HIP, TOTAL, ANTERIOR APPROACH
Anesthesia: Spinal | Site: Hip | Laterality: Right

## 2020-03-04 MED ORDER — LACTATED RINGERS IV SOLN: INTRAVENOUS | Status: DC

## 2020-03-04 MED ORDER — MORPHINE SULFATE (PF) 4 MG/ML IV SOLN: 0.5000 mg | INTRAVENOUS | Status: DC | PRN

## 2020-03-04 MED ORDER — ORAL CARE MOUTH RINSE: 15.0000 mL | Freq: Once | OROMUCOSAL | Status: AC

## 2020-03-04 MED ORDER — FENTANYL CITRATE (PF) 100 MCG/2ML IJ SOLN
INTRAMUSCULAR | Status: DC | PRN
  Administered 2020-03-04: 50 ug via INTRAVENOUS

## 2020-03-04 MED ORDER — PHENOL 1.4 % MT LIQD: 1.0000 | OROMUCOSAL | Status: DC | PRN

## 2020-03-04 MED ORDER — ACETAMINOPHEN 500 MG PO TABS: 500.0000 mg | ORAL_TABLET | Freq: Four times a day (QID) | ORAL | Status: DC

## 2020-03-04 MED ORDER — LIDOCAINE HCL (CARDIAC) PF 100 MG/5ML IV SOSY
PREFILLED_SYRINGE | INTRAVENOUS | Status: DC | PRN
  Administered 2020-03-04: 20 mg via INTRATRACHEAL
  Administered 2020-03-04: 40 mg via INTRATRACHEAL

## 2020-03-04 MED ORDER — MENTHOL 3 MG MT LOZG: 1.0000 | LOZENGE | OROMUCOSAL | Status: DC | PRN

## 2020-03-04 MED ORDER — ACETAMINOPHEN 10 MG/ML IV SOLN: 1000.0000 mg | Freq: Once | INTRAVENOUS | Status: DC | PRN

## 2020-03-04 MED ORDER — FENTANYL CITRATE (PF) 100 MCG/2ML IJ SOLN
INTRAMUSCULAR | Status: AC
  Filled 2020-03-04: qty 2

## 2020-03-04 MED ORDER — LIDOCAINE HCL (PF) 2 % IJ SOLN
INTRAMUSCULAR | Status: AC
  Filled 2020-03-04: qty 5

## 2020-03-04 MED ORDER — DEXAMETHASONE SODIUM PHOSPHATE 10 MG/ML IJ SOLN
INTRAMUSCULAR | Status: AC
  Filled 2020-03-04: qty 1

## 2020-03-04 MED ORDER — BUPIVACAINE-EPINEPHRINE (PF) 0.25% -1:200000 IJ SOLN
INTRAMUSCULAR | Status: DC | PRN
  Administered 2020-03-04: 30 mL

## 2020-03-04 MED ORDER — PROPOFOL 10 MG/ML IV BOLUS
INTRAVENOUS | Status: AC
  Filled 2020-03-04: qty 20

## 2020-03-04 MED ORDER — ALBUMIN HUMAN 5 % IV SOLN: INTRAVENOUS | Status: DC | PRN

## 2020-03-04 MED ORDER — TRANEXAMIC ACID-NACL 1000-0.7 MG/100ML-% IV SOLN
INTRAVENOUS | Status: AC
  Filled 2020-03-04: qty 100

## 2020-03-04 MED ORDER — BISACODYL 5 MG PO TBEC: 5.0000 mg | DELAYED_RELEASE_TABLET | Freq: Every day | ORAL | Status: DC | PRN

## 2020-03-04 MED ORDER — BUPIVACAINE-MELOXICAM ER 400-12 MG/14ML IJ SOLN
INTRAMUSCULAR | Status: AC
  Filled 2020-03-04: qty 1

## 2020-03-04 MED ORDER — TIZANIDINE HCL 4 MG PO TABS: 4.0000 mg | ORAL_TABLET | Freq: Four times a day (QID) | ORAL | 1 refills | Status: DC | PRN

## 2020-03-04 MED ORDER — LACTATED RINGERS IV BOLUS
500.0000 mL | Freq: Once | INTRAVENOUS | Status: AC
  Administered 2020-03-04: 500 mL via INTRAVENOUS

## 2020-03-04 MED ORDER — CEFAZOLIN SODIUM-DEXTROSE 2-4 GM/100ML-% IV SOLN: 2.0000 g | Freq: Four times a day (QID) | INTRAVENOUS | Status: DC

## 2020-03-04 MED ORDER — STERILE WATER FOR IRRIGATION IR SOLN
Status: DC | PRN
Start: 2020-03-04 — End: 2020-03-04
  Administered 2020-03-04: 2000 mL

## 2020-03-04 MED ORDER — LACTATED RINGERS IV BOLUS: 250.0000 mL | Freq: Once | INTRAVENOUS | Status: DC

## 2020-03-04 MED ORDER — KETOROLAC TROMETHAMINE 15 MG/ML IJ SOLN: 7.5000 mg | Freq: Four times a day (QID) | INTRAMUSCULAR | Status: DC

## 2020-03-04 MED ORDER — AMISULPRIDE (ANTIEMETIC) 5 MG/2ML IV SOLN: 10.0000 mg | Freq: Once | INTRAVENOUS | Status: DC | PRN

## 2020-03-04 MED ORDER — HYDROCODONE-ACETAMINOPHEN 5-325 MG PO TABS: 1.0000 | ORAL_TABLET | Freq: Four times a day (QID) | ORAL | 0 refills | Status: DC | PRN

## 2020-03-04 MED ORDER — ONDANSETRON HCL 4 MG/2ML IJ SOLN: 4.0000 mg | Freq: Four times a day (QID) | INTRAMUSCULAR | Status: DC | PRN

## 2020-03-04 MED ORDER — CEFAZOLIN SODIUM-DEXTROSE 2-4 GM/100ML-% IV SOLN
2.0000 g | INTRAVENOUS | Status: AC
  Administered 2020-03-04: 2 g via INTRAVENOUS
  Filled 2020-03-04: qty 100

## 2020-03-04 MED ORDER — METHOCARBAMOL 500 MG IVPB - SIMPLE MED: 500.0000 mg | Freq: Four times a day (QID) | INTRAVENOUS | Status: DC | PRN

## 2020-03-04 MED ORDER — 0.9 % SODIUM CHLORIDE (POUR BTL) OPTIME
TOPICAL | Status: DC | PRN
  Administered 2020-03-04: 1000 mL

## 2020-03-04 MED ORDER — ACETAMINOPHEN 160 MG/5ML PO SOLN: 325.0000 mg | Freq: Once | ORAL | Status: DC | PRN

## 2020-03-04 MED ORDER — HYDROCODONE-ACETAMINOPHEN 7.5-325 MG PO TABS: 1.0000 | ORAL_TABLET | ORAL | Status: DC | PRN

## 2020-03-04 MED ORDER — BUPIVACAINE-EPINEPHRINE (PF) 0.25% -1:200000 IJ SOLN
INTRAMUSCULAR | Status: AC
  Filled 2020-03-04: qty 30

## 2020-03-04 MED ORDER — PROPOFOL 500 MG/50ML IV EMUL
INTRAVENOUS | Status: AC
  Filled 2020-03-04: qty 50

## 2020-03-04 MED ORDER — BUPIVACAINE IN DEXTROSE 0.75-8.25 % IT SOLN
INTRATHECAL | Status: DC | PRN
  Administered 2020-03-04: 2 mL via INTRATHECAL

## 2020-03-04 MED ORDER — MEPERIDINE HCL 50 MG/ML IJ SOLN: 6.2500 mg | INTRAMUSCULAR | Status: DC | PRN

## 2020-03-04 MED ORDER — DEXAMETHASONE SODIUM PHOSPHATE 10 MG/ML IJ SOLN
INTRAMUSCULAR | Status: DC | PRN
  Administered 2020-03-04: 10 mg via INTRAVENOUS

## 2020-03-04 MED ORDER — PROPOFOL 500 MG/50ML IV EMUL
INTRAVENOUS | Status: DC | PRN
  Administered 2020-03-04: 50 ug/kg/min via INTRAVENOUS

## 2020-03-04 MED ORDER — ALUM & MAG HYDROXIDE-SIMETH 200-200-20 MG/5ML PO SUSP: 30.0000 mL | ORAL | Status: DC | PRN

## 2020-03-04 MED ORDER — GLYCOPYRROLATE PF 0.2 MG/ML IJ SOSY
PREFILLED_SYRINGE | INTRAMUSCULAR | Status: DC | PRN
  Administered 2020-03-04: .2 mg via INTRAVENOUS

## 2020-03-04 MED ORDER — ACETAMINOPHEN 325 MG PO TABS: 325.0000 mg | ORAL_TABLET | Freq: Once | ORAL | Status: DC | PRN

## 2020-03-04 MED ORDER — CHLORHEXIDINE GLUCONATE 0.12 % MT SOLN
15.0000 mL | Freq: Once | OROMUCOSAL | Status: AC
  Administered 2020-03-04: 15 mL via OROMUCOSAL

## 2020-03-04 MED ORDER — TRANEXAMIC ACID 1000 MG/10ML IV SOLN
INTRAVENOUS | Status: DC | PRN
  Administered 2020-03-04: 2000 mg via TOPICAL

## 2020-03-04 MED ORDER — METOCLOPRAMIDE HCL 5 MG/ML IJ SOLN: 5.0000 mg | Freq: Three times a day (TID) | INTRAMUSCULAR | Status: DC | PRN

## 2020-03-04 MED ORDER — MIDAZOLAM HCL 2 MG/2ML IJ SOLN
INTRAMUSCULAR | Status: AC
  Filled 2020-03-04: qty 2

## 2020-03-04 MED ORDER — ONDANSETRON HCL 4 MG/2ML IJ SOLN
INTRAMUSCULAR | Status: DC | PRN
  Administered 2020-03-04: 4 mg via INTRAVENOUS

## 2020-03-04 MED ORDER — METOCLOPRAMIDE HCL 5 MG PO TABS
5.0000 mg | ORAL_TABLET | Freq: Three times a day (TID) | ORAL | Status: DC | PRN
  Filled 2020-03-04: qty 2

## 2020-03-04 MED ORDER — PROPOFOL 500 MG/50ML IV EMUL
INTRAVENOUS | Status: DC | PRN
  Administered 2020-03-04: 50 mg via INTRAVENOUS
  Administered 2020-03-04: 30 mg via INTRAVENOUS
  Administered 2020-03-04 (×2): 50 mg via INTRAVENOUS

## 2020-03-04 MED ORDER — ACETAMINOPHEN 325 MG PO TABS: 325.0000 mg | ORAL_TABLET | Freq: Four times a day (QID) | ORAL | Status: DC | PRN

## 2020-03-04 MED ORDER — ONDANSETRON HCL 4 MG PO TABS
4.0000 mg | ORAL_TABLET | Freq: Four times a day (QID) | ORAL | Status: DC | PRN
  Filled 2020-03-04: qty 1

## 2020-03-04 MED ORDER — ONDANSETRON HCL 4 MG/2ML IJ SOLN
INTRAMUSCULAR | Status: AC
  Filled 2020-03-04: qty 2

## 2020-03-04 MED ORDER — MIDAZOLAM HCL 5 MG/5ML IJ SOLN
INTRAMUSCULAR | Status: DC | PRN
  Administered 2020-03-04: 2 mg via INTRAVENOUS

## 2020-03-04 MED ORDER — DIPHENHYDRAMINE HCL 12.5 MG/5ML PO ELIX
12.5000 mg | ORAL_SOLUTION | ORAL | Status: DC | PRN
  Filled 2020-03-04: qty 10

## 2020-03-04 MED ORDER — METHOCARBAMOL 500 MG PO TABS: 500.0000 mg | ORAL_TABLET | Freq: Four times a day (QID) | ORAL | Status: DC | PRN

## 2020-03-04 MED ORDER — HYDROCODONE-ACETAMINOPHEN 5-325 MG PO TABS: 1.0000 | ORAL_TABLET | ORAL | Status: DC | PRN

## 2020-03-04 MED ORDER — GLYCOPYRROLATE PF 0.2 MG/ML IJ SOSY
PREFILLED_SYRINGE | INTRAMUSCULAR | Status: AC
  Filled 2020-03-04: qty 1

## 2020-03-04 MED ORDER — DOCUSATE SODIUM 100 MG PO CAPS: 100.0000 mg | ORAL_CAPSULE | Freq: Two times a day (BID) | ORAL | Status: DC

## 2020-03-04 MED ORDER — TRANEXAMIC ACID-NACL 1000-0.7 MG/100ML-% IV SOLN
1000.0000 mg | INTRAVENOUS | Status: AC
  Administered 2020-03-04: 1000 mg via INTRAVENOUS
  Filled 2020-03-04: qty 100

## 2020-03-04 MED ORDER — ASPIRIN 81 MG PO CHEW: 81.0000 mg | CHEWABLE_TABLET | Freq: Two times a day (BID) | ORAL | Status: DC

## 2020-03-04 MED ORDER — TRANEXAMIC ACID-NACL 1000-0.7 MG/100ML-% IV SOLN
1000.0000 mg | Freq: Once | INTRAVENOUS | Status: AC
  Administered 2020-03-04: 1000 mg via INTRAVENOUS

## 2020-03-04 MED ORDER — POVIDONE-IODINE 10 % EX SWAB
2.0000 "application " | Freq: Once | CUTANEOUS | Status: AC
  Administered 2020-03-04: 2 via TOPICAL

## 2020-03-04 MED ORDER — BUPIVACAINE-MELOXICAM ER 400-12 MG/14ML IJ SOLN
INTRAMUSCULAR | Status: DC | PRN
  Administered 2020-03-04: 400 mg

## 2020-03-04 MED ORDER — ALBUMIN HUMAN 5 % IV SOLN
INTRAVENOUS | Status: AC
  Filled 2020-03-04: qty 250

## 2020-03-04 MED ORDER — HYDROMORPHONE HCL 1 MG/ML IJ SOLN
0.2500 mg | INTRAMUSCULAR | Status: DC | PRN
Start: 2020-03-04 — End: 2020-03-04

## 2020-03-04 MED ORDER — LACTATED RINGERS IV BOLUS
250.0000 mL | Freq: Once | INTRAVENOUS | Status: AC
  Administered 2020-03-04: 250 mL via INTRAVENOUS

## 2020-03-04 MED ORDER — CEFAZOLIN SODIUM-DEXTROSE 2-4 GM/100ML-% IV SOLN
INTRAVENOUS | Status: AC
  Administered 2020-03-04: 2 g via INTRAVENOUS
  Filled 2020-03-04: qty 100

## 2020-03-04 SURGICAL SUPPLY — 46 items
BAG DECANTER FOR FLEXI CONT (MISCELLANEOUS) ×2 IMPLANT
BLADE SAW SGTL 13X75X1.27 (BLADE) ×1 IMPLANT
BLADE SAW SGTL 18X1.27X75 (BLADE) ×2 IMPLANT
BOOTIES KNEE HIGH SLOAN (MISCELLANEOUS) ×2 IMPLANT
CELLS DAT CNTRL 66122 CELL SVR (MISCELLANEOUS) ×1 IMPLANT
COVER PERINEAL POST (MISCELLANEOUS) ×2 IMPLANT
COVER SURGICAL LIGHT HANDLE (MISCELLANEOUS) ×2 IMPLANT
COVER WAND RF STERILE (DRAPES) IMPLANT
CUP ACETABULAR GRIPTON 100 52 (Orthopedic Implant) IMPLANT
DECANTER SPIKE VIAL GLASS SM (MISCELLANEOUS) ×2 IMPLANT
DRAPE IMP U-DRAPE 54X76 (DRAPES) ×2 IMPLANT
DRAPE ORTHO SPLIT 77X108 STRL (DRAPES)
DRAPE STERI IOBAN 125X83 (DRAPES) ×2 IMPLANT
DRAPE SURG ORHT 6 SPLT 77X108 (DRAPES) IMPLANT
DRAPE U-SHAPE 47X51 STRL (DRAPES) ×4 IMPLANT
DRSG AQUACEL AG ADV 3.5X 6 (GAUZE/BANDAGES/DRESSINGS) ×2 IMPLANT
DURAPREP 26ML APPLICATOR (WOUND CARE) ×2 IMPLANT
ELECT BLADE TIP CTD 4 INCH (ELECTRODE) ×2 IMPLANT
ELECT REM PT RETURN 15FT ADLT (MISCELLANEOUS) ×2 IMPLANT
ELIMINATOR HOLE APEX DEPUY (Hips) ×2 IMPLANT
GLOVE SRG 8 PF TXTR STRL LF DI (GLOVE) ×2 IMPLANT
GLOVE SURG ENC MOIS LTX SZ8 (GLOVE) ×4 IMPLANT
GLOVE SURG UNDER POLY LF SZ8 (GLOVE) ×4
GOWN STRL REUS W/TWL XL LVL3 (GOWN DISPOSABLE) ×4 IMPLANT
GRIPTON 100 52 (Orthopedic Implant) ×2 IMPLANT
HEAD CERAMIC 36 PLUS5 (Hips) ×2 IMPLANT
HOLDER FOLEY CATH W/STRAP (MISCELLANEOUS) ×2 IMPLANT
KIT TURNOVER KIT A (KITS) ×2 IMPLANT
LINER ACETAB NEUTRAL 36ID 520D (Liner) ×1 IMPLANT
MANIFOLD NEPTUNE II (INSTRUMENTS) ×2 IMPLANT
NEEDLE HYPO 22GX1.5 SAFETY (NEEDLE) ×2 IMPLANT
NS IRRIG 1000ML POUR BTL (IV SOLUTION) ×2 IMPLANT
PACK ANTERIOR HIP CUSTOM (KITS) ×2 IMPLANT
PENCIL SMOKE EVACUATOR (MISCELLANEOUS) IMPLANT
PROTECTOR NERVE ULNAR (MISCELLANEOUS) ×2 IMPLANT
RTRCTR WOUND ALEXIS 18CM MED (MISCELLANEOUS) ×2
STEM FEMORAL SZ5 HIGH ACTIS (Stem) IMPLANT
STEM FEMORAL SZ6 HIGH ACTIS (Stem) ×1 IMPLANT
SUT ETHIBOND NAB CT1 #1 30IN (SUTURE) ×4 IMPLANT
SUT VIC AB 1 CT1 36 (SUTURE) ×2 IMPLANT
SUT VIC AB 2-0 CT1 27 (SUTURE) ×2
SUT VIC AB 2-0 CT1 TAPERPNT 27 (SUTURE) ×1 IMPLANT
SUT VICRYL AB 3-0 FS1 BRD 27IN (SUTURE) ×2 IMPLANT
SUT VLOC 180 0 24IN GS25 (SUTURE) ×2 IMPLANT
SYR 50ML LL SCALE MARK (SYRINGE) ×2 IMPLANT
TRAY FOLEY MTR SLVR 16FR STAT (SET/KITS/TRAYS/PACK) ×2 IMPLANT

## 2020-03-04 NOTE — Evaluation (Addendum)
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 male s/p Rt THA on 03/04/2020 with PMH significant for HLD, a-fib, depressino, OA, RTCR (2008), lumbar laminectomy (1974/1977), and cervical laminectomy (1992).  Clinical Impression  Pt is a 70y.o. male s/p Rt THA POD 0. Pt reports that he is independent with mobility at baseline. Pt required MIN guard and verbal cues for sit to stand transfers. Pt required MIN guard to supervision for ambulation 127ft with verbal cues for RW management and step to gait pattern, pt progressed to step through pattern with no LOB. Pt was able to safely perform stair negotiation with 1 rail and MIN guard for safety with cues for sequencing. Pt was able to verbalize safe guarding position for family members at home. PT reviewed therapeutic intervention and HEP for promotion of DVT prevention and LE strengthening/ROM, pt demonstrated understanding. Pt will have assist from wife and family at home. Pt is currently at safe mobility level for discharge. Recommend home with family support. Pt would benefit from skilled PT to increase independence and safety with mobility.      Follow Up Recommendations No PT follow up;Follow surgeon's recommendation for DC plan and follow-up therapies    Equipment Recommendations  None recommended by PT (pt owns RW)    Recommendations for Other Services       Precautions / Restrictions Precautions Precautions: Fall Restrictions Weight Bearing Restrictions: No Other Position/Activity Restrictions: WBAT      Mobility  Bed Mobility Overal bed mobility: Needs Assistance Bed Mobility: Supine to Sit     Supine to sit: Supervision;HOB elevated     General bed mobility comments: supervision for safety with use of B UEs to scoot to EOB.    Transfers Overall transfer level: Needs assistance Equipment  used: Rolling walker (2 wheeled) Transfers: Sit to/from Stand Sit to Stand: Min guard         General transfer comment: PT reviewd WBAT status and provided MIN guard for safety with cues for safe hand placment.  Ambulation/Gait Ambulation/Gait assistance: Min guard;Supervision Gait Distance (Feet): 120 Feet Assistive device: Rolling walker (2 wheeled) Gait Pattern/deviations: Step-to pattern;Step-through pattern;Decreased stride length;Decreased weight shift to right Gait velocity: fair      Stairs Stairs: Yes Stairs assistance: Min guard Stair Management: One rail Left;Forwards Number of Stairs: 3 General stair comments: MIN guard for safety with cues for sequencing "up with the good, down with the bad." Pt able to verbalize safe guarding position for family members at home.  Wheelchair Mobility    Modified Rankin (Stroke Patients Only)       Balance Overall balance assessment: Needs assistance Sitting-balance support: Feet supported Sitting balance-Leahy Scale: Good     Standing balance support: Bilateral upper extremity supported;During functional activity;No upper extremity supported Standing balance-Leahy Scale: Fair Standing balance comment: pt was able to maintain standing balance with MIN guard-supervision for safety without use of UEs on RW while using urinal.                             Pertinent Vitals/Pain Pain Assessment: 0-10 Pain Score: 2  Pain Location: Rt hip Pain Descriptors / Indicators: Tightness Pain Intervention(s): Limited activity within patient's tolerance;Monitored during session;Repositioned;Ice applied    Home Living Family/patient expects to be discharged to:: Private residence Living Arrangements: Spouse/significant other Available Help at Discharge:  Family;Friend(s);Neighbor Type of Home: House Home Access: Stairs to enter Entrance Stairs-Rails: Left Entrance Stairs-Number of Steps: 5 Home Layout: Two level Home  Equipment: [place] - 2 wheels;Bedside commode;Shower seat;Crutches Additional Comments: Pt will have assistance from wife and family at home. He can stay on main levle if needed, but prefers to sleep in 2nd level bedroom.    Prior Function Level of Independence: Independent               Hand Dominance   Dominant Hand: Right    Extremity/Trunk Assessment   Upper Extremity Assessment Upper Extremity Assessment: Overall WFL for tasks assessed    Lower Extremity Assessment Lower Extremity Assessment: RLE deficits/detail RLE Deficits / Details: pt with good Rt quad set strength and 4+/5 B dorsi/plantar flexion strength. RLE Sensation: WNL RLE Coordination: WNL    Cervical / Trunk Assessment Cervical / Trunk Assessment: Normal  Communication   Communication: No difficulties  Cognition Arousal/Alertness: Awake/alert Behavior During Therapy: WFL for tasks assessed/performed Overall Cognitive Status: Within Functional Limits for tasks assessed                                        General Comments      Exercises Total Joint Exercises Ankle Circles/Pumps: AROM;Both;20 reps;Seated Quad Sets: AROM;Right;5 reps;Seated Short Arc Quad: AROM;Right;5 reps;Seated Heel Slides: AROM;Right;5 reps;Seated Hip ABduction/ADduction: AROM;Right;5 reps;Seated Long Arc Quad: AROM;Right;5 reps;Seated   Assessment/Plan      03/04/20 1400  PT Assessment  PT Recommendation/Assessment Patient needs continued PT services  PT Visit Diagnosis Unsteadiness on feet (R26.81);Muscle weakness (generalized) (M62.81);Pain  Pain - Right/Left Right  Pain - part of body Hip  PT Problem List Decreased strength;Decreased range of motion;Decreased activity tolerance;Decreased balance;Decreased mobility;Decreased knowledge of use of DME;Decreased knowledge of precautions;Pain            PT Goals (Current goals can be found in the Care Plan section)  Acute Rehab PT Goals Patient  Stated Goal: improved mobility and have decreased pain levels compared to prior to surgery PT Goal Formulation: With patient Time For Goal Achievement: 03/11/20 Potential to Achieve Goals: Good    Frequency  7x/week   PT Treatment/Interventions  DME instruction;Gait training;Stair training;Functional mobility training;Therapeutic activities;Therapeutic exercise;Balance training;Patient/family education;  Barriers to discharge        AM-PAC PT "6 Clicks" Mobility  Outcome Measure Help needed turning from your back to your side while in a flat bed without using bedrails?: None Help needed moving from lying on your back to sitting on the side of a flat bed without using bedrails?: None Help needed moving to and from a bed to a chair (including a wheelchair)?: A Little Help needed standing up from a chair using your arms (e.g., wheelchair or bedside chair)?: A Little Help needed to walk in hospital room?: A Little Help needed climbing 3-5 steps with a railing? : A Little 6 Click Score: 20    End of Session Equipment Utilized During Treatment: Gait belt Activity Tolerance: Patient tolerated treatment well Patient left: in chair;with call bell/phone within reach Nurse Communication: Mobility status PT Visit Diagnosis: Unsteadiness on feet (R26.81);Muscle weakness (generalized) (M62.81);Pain Pain - Right/Left: Right Pain - part of body: Hip    Time: 2703-5009 PT Time Calculation (min) (ACUTE ONLY): 27 min     03/04/20 1400  PT Evaluation  $PT Eval Low Complexity 1 Low  PT Treatments  $Gait  Training 8-22 mins          Elna Breslow, SPT  Acute rehab    Elna Breslow 03/04/2020, 3:43 PM     I agree with the following treatment note after reviewing documentation. This session was performed under the supervision of a licensed clinician.   Verner Mould, DPT Acute Rehabilitation Services Office [phone] Pager [phone]

## 2020-03-04 NOTE — Interval H&P Note (Signed)
History and Physical Interval Note:  03/04/2020 7:29 AM  [name], MD  has presented today for surgery, with the diagnosis of RIGHT HIP DEGENERATIVE JOINT DISEASE.  The various methods of treatment have been discussed with the patient and family. After consideration of risks, benefits and other options for treatment, the patient has consented to  Procedure(s): RIGHT TOTAL HIP ARTHROPLASTY ANTERIOR APPROACH (Right) as a surgical intervention.  The patient's history has been reviewed, patient examined, no change in status, stable for surgery.  I have reviewed the patient's chart and labs.  Questions were answered to the patient's satisfaction.     Hessie Dibble

## 2020-03-04 NOTE — Discharge Instructions (Signed)

## 2020-03-04 NOTE — Op Note (Signed)

## 2020-03-04 NOTE — Anesthesia Postprocedure Evaluation (Signed)
Anesthesia Post Note  Patient: [name], MD  Procedure(s) Performed: RIGHT TOTAL HIP ARTHROPLASTY ANTERIOR APPROACH (Right Hip)     Patient location during evaluation: PACU Anesthesia Type: Spinal Level of consciousness: oriented and awake and alert Pain management: pain level controlled Vital Signs Assessment: post-procedure vital signs reviewed and stable Respiratory status: spontaneous breathing, respiratory function stable and patient connected to nasal cannula oxygen Cardiovascular status: blood pressure returned to baseline and stable Postop Assessment: no headache, no backache, no apparent nausea or vomiting and spinal receding Anesthetic complications: no   No complications documented.  Last Vitals:  Vitals:   03/04/20 1219 03/04/20 1300  BP: 115/63 115/68  Pulse:  66  Resp:    Temp: 36.6 C   SpO2:  99%    Last Pain:  Vitals:   03/04/20 1219  TempSrc: Axillary  PainSc:                  [name]

## 2020-03-04 NOTE — Anesthesia Procedure Notes (Signed)
Spinal  Start time: 03/04/2020 7:35 AM End time: 03/04/2020 7:37 AM Staffing Performed: anesthesiologist  Anesthesiologist: Effie Berkshire, MD Preanesthetic Checklist Completed: patient identified, IV checked, site marked, risks and benefits discussed, surgical consent, monitors and equipment checked, pre-op evaluation and timeout performed Spinal Block Patient position: sitting Prep: DuraPrep and site prepped and draped Location: L3-4 Injection technique: single-shot Needle Needle type: Pencan  Needle gauge: 24 G Needle length: 10 cm Needle insertion depth: 10 cm Additional Notes Patient tolerated well. No immediate complications.

## 2020-03-04 NOTE — Anesthesia Procedure Notes (Signed)
Procedure Name: MAC Date/Time: 03/04/2020 7:35 AM Performed by: Michele Rockers, CRNA Pre-anesthesia Checklist: Patient identified, Emergency Drugs available, Suction available, Timeout performed and Patient being monitored Patient Re-evaluated:Patient Re-evaluated prior to induction Oxygen Delivery Method: Simple face mask

## 2020-03-04 NOTE — Transfer of Care (Signed)
Immediate Anesthesia Transfer of Care Note  Patient: Tyrone Seat, MD  Procedure(s) Performed: RIGHT TOTAL HIP ARTHROPLASTY ANTERIOR APPROACH (Right Hip)  Patient Location: PACU  Anesthesia Type:Spinal  Level of Consciousness: drowsy and patient cooperative  Airway & Oxygen Therapy: Patient Spontanous Breathing and Patient connected to face mask oxygen  Post-op Assessment: Report given to RN and Post -op Vital signs reviewed and stable  Post vital signs: Reviewed and stable  Last Vitals:  Vitals Value Taken Time  BP 100/67 03/04/20 0950  Temp    Pulse 72 03/04/20 0952  Resp 15 03/04/20 0952  SpO2 92 % 03/04/20 0952  Vitals shown include unvalidated device data.  Last Pain:  Vitals:   03/04/20 0608  TempSrc:   PainSc: 0-No pain         Complications: No complications documented.

## 2020-03-06 ENCOUNTER — Encounter (HOSPITAL_COMMUNITY): Payer: Self-pay | Admitting: Orthopaedic Surgery

## 2020-04-11 ENCOUNTER — Other Ambulatory Visit (HOSPITAL_COMMUNITY): Payer: Self-pay | Admitting: Emergency Medicine

## 2020-04-22 ENCOUNTER — Ambulatory Visit (HOSPITAL_BASED_OUTPATIENT_CLINIC_OR_DEPARTMENT_OTHER)
Admission: RE | Admit: 2020-04-22 | Discharge: 2020-04-22 | Disposition: A | Discharge status: Home / Self Care | Payer: Medicare Other | Source: Ambulatory Visit | Attending: Cardiology | Admitting: Cardiology

## 2020-04-22 ENCOUNTER — Other Ambulatory Visit: Payer: Self-pay

## 2020-06-20 ENCOUNTER — Other Ambulatory Visit: Payer: Self-pay

## 2020-06-20 ENCOUNTER — Ambulatory Visit (INDEPENDENT_AMBULATORY_CARE_PROVIDER_SITE_OTHER): Payer: Medicare Other | Admitting: Cardiovascular Disease

## 2020-06-20 ENCOUNTER — Encounter: Payer: Self-pay | Admitting: Cardiovascular Disease

## 2020-06-20 VITALS — BP 130/70 | HR 69 | Ht 70.0 in | Wt 222.4 lb

## 2020-06-20 DIAGNOSIS — G4733 Obstructive sleep apnea (adult) (pediatric): Secondary | ICD-10-CM | POA: Diagnosis not present

## 2020-06-20 DIAGNOSIS — I480 Paroxysmal atrial fibrillation: Secondary | ICD-10-CM

## 2020-06-20 NOTE — Patient Instructions (Signed)
Medication Instructions:  Continue current medications  *If you need a refill on your cardiac medications before your next appointment, please call your pharmacy*   Lab Work: None Ordered   Testing/Procedures: None Ordered   Follow-Up: At [facility], you and your health needs are our priority.  As part of our continuing mission to provide you with exceptional heart care, we have created designated Provider Care Teams.  These Care Teams include your primary Cardiologist (physician) and Advanced Practice Providers (APPs -  Physician Assistants and Nurse Practitioners) who all work together to provide you with the care you need, when you need it.  We recommend signing up for the patient portal called "MyChart".  Sign up information is provided on this After Visit Summary.  MyChart is used to connect with patients for Virtual Visits (Telemedicine).  Patients are able to view lab/test results, encounter notes, upcoming appointments, etc.  Non-urgent messages can be sent to your provider as well.   To learn more about what you can do with MyChart, go to [email or web address].    Your next appointment:   1 year(s)  The format for your next appointment:   In Person  Provider:   You may see Kaylub Detienne Kelly, MD or one of the following Advanced Practice Providers on your designated Care Team:   Hao Meng, PA-C Angela Duke, PA-C or  Krista Kroeger, PA-C     

## 2020-06-22 NOTE — Progress Notes (Addendum)
Cardiology Office Note    Date:  06/22/2020   ID:  Tyrone Seat, [name], DOB 1950-04-11, MRN [id number]  PCP:  Aurea Graff.[name], [name]  Cardiologist:  Shelva Majestic, [name]   Sleep evaluation   History of Present Illness:  Tyrone Seat, [name] is a 70 y.o. male who is followed by Dr. Donnie Coffin for his primary care.  He has a history of obstructive sleep apnea that was diagnosed in 2016 and has been on CPAP therapy since that time.  Recently, his CPAP machine sent an alert message that his motor is reaching end-of-life.  He is in need for a new machine and presents for evaluation.  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  He was found to have severe sleep apnea on the diagnostic portion of the study and had an overall AHI of 36.5/h but during REM sleep this was very severe with an AHI of 75.6/h.  He had a significant positional component with his AHI during sleep at 50.1/h and the AHI with nonsupine sleep at only 1.7/h.  During the titration phase of the split-night protocol CPAP initiated at 4 cm and was increased to 14 cm of pressure.  His AHI at 14 cm was 0.  REM supine sleep was achieved during the titration.  Of note, there was "REM rebound "with the CPAP portion of the study.  He received a ResMed air sense 10 AutoSet CPAP unit on March 27, 2014 with [facility] as his DME company.  Initially he had used a DreamWear nasal mask and recently since his mask had broken he initiated use of a ResMed AirFit N 30i.  Over the past 6 years, his compliance has consistently been excellent.  His machine is now no longer downloadable due to it is not a 5G compatible and he brought his machine to [facility] for a download prior to this office visit.  A download from May 19, 2020 through June 17, 2020 shows 100% compliance.  He had recently tested positive for COVID  last month and was asymptomatic but since he has noticed increased sleep duration.  On his most recent download average usage was 10 hours per night.  AHI is excellent at 1.4 and his 95th percentile pressure is 10.4 with maximum average pressure at 11.6.  Several weeks ago when the nasal Dreamware mask cracked he was out of town and that night he slept poorly without CPAP.  Presently, he denies any awareness of breakthrough snoring, his sleep is restorative, he is unaware of bruxism or restless legs.  He typically goes to bed at 10 PM and wakes up around 7 AM since his retirement.  An Epworth Sleepiness Scale score was calculated in the office today and this endorsed at 4 arguing against residual daytime sleepiness.  He has a history of atrial fibrillation/atrial tachycardia diagnosed in 2019 but this has remained stable without recurrence.  He is not on any antiarrhythmic therapy, beta-blocker therapy or anticoagulation.  He is followed by Dr. Alroy Dust and has been on bupropion and Trintellix for mild depression.  An echocardiogram in August 2019 showed an EF of 55 to 60% with normal wall motion and without valvular abnormalities.  He underwent CT cardiac calcium scoring on Doctor's Day this year and his calcium score was 0.  Due to his need for a new CPAP machine, per guidelines he  is now seen for face-to-face evaluation so that a new machine may be ordered.  Past Medical History:  Diagnosis Date   Adenomatous polyp of colon    Arthritis    Contact lens/glasses fitting    wears contacts or glasses   Depression    Diverticulitis of colon 06/2010   recurrent   Dysrhythmia    Afib   Hyperlipidemia    Obstructive sleep apnea    uses CPAP, followed by Dr Claiborne Billings   Wears hearing aid    both ears    Past Surgical History:  Procedure Laterality Date   CERVICAL LAMINECTOMY  1992   COLONOSCOPY     KNEE ARTHROSCOPY  2012   bilateral   LESION REMOVAL N/A 02/06/2013   Procedure: MINOR EXICISION  OF SCALP LESION;  Surgeon: Shann Medal, [name];  Location: [facility];  Service: General;  Laterality: N/A;   LUMBAR LAMINECTOMY  1974 &1977   ROTATOR CUFF REPAIR  2008   left   SHOULDER ARTHROSCOPY  2008   right   TOTAL HIP ARTHROPLASTY Right 03/04/2020   Procedure: RIGHT TOTAL HIP ARTHROPLASTY ANTERIOR APPROACH;  Surgeon: Melrose Nakayama, [name];  Location: [facility];  Service: Orthopedics;  Laterality: Right;    Current Medications: Outpatient Medications Prior to Visit  Medication Sig Dispense Refill   DHA-Vitamin C-Lutein (EYE HEALTH FORMULA PO) Take 1 tablet by mouth daily.     Multiple Vitamins-Minerals (MULTIVITAMIN WITH MINERALS) tablet Take 1 tablet by mouth daily.     TRINTELLIX 20 MG TABS tablet Take 20 mg by mouth daily.     buPROPion (WELLBUTRIN XL) 150 MG 24 hr tablet Take 1 tablet by mouth every morning.     HYDROcodone-acetaminophen (NORCO/VICODIN) 5-325 MG tablet Take 1-2 tablets by mouth every 6 (six) hours as needed for moderate pain. 40 tablet 0   tiZANidine (ZANAFLEX) 4 MG tablet Take 1 tablet (4 mg total) by mouth every 6 (six) hours as needed for muscle spasms. 40 tablet 1   No facility-administered medications prior to visit.     Allergies:   Patient has no known allergies.   Social History   Socioeconomic History   Marital status: Married    Spouse name: Not on file   Number of children: Not on file   Years of education: Not on file   Highest education level: Not on file  Occupational History   Occupation: Radiologist    Employer: [employer]  Tobacco Use   Smoking status: Never   Smokeless tobacco: Never  Vaping Use   Vaping Use: Never used  Substance and Sexual Activity   Alcohol use: Yes    Alcohol/week: 7.0 standard drinks    Types: 7 Glasses of wine per week    Comment: soc.   Drug use: No   Sexual activity: Not on file  Other Topics Concern   Not on file  Social History Narrative    Lives with spouse in [place].  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  He is married for 75 years and has 3 children, twin sons age 21 and a daughter age 60.  He remains active, does a lot of yard work and walks 3 days/week for at least 30 minutes.   Family History:  The patient's family history includes Colon cancer in his father and maternal grandmother.  His mother died at age 80 and father died at age 32. He has 2 brothers age 69 and 47 and had 3 sisters, 1 is deceased.  ROS General: Negative; No fevers, chills, or night sweats;  HEENT:    hearing aids; No changes in vision, sinus congestion, difficulty swallowing Pulmonary: Negative; No cough, wheezing, shortness of breath, hemoptysis Cardiovascular: An episode of atrial fibrillation in 2019 GI: Negative; No nausea, vomiting, diarrhea, or abdominal pain GU: Negative; No dysuria, hematuria, or difficulty voiding Musculoskeletal: Multiple surgeries as noted above Hematologic/Oncology: Negative; no easy bruising, bleeding Endocrine: Negative; no heat/cold intolerance; no diabetes Neuro: Negative; no changes in balance, headaches Skin: Negative; No rashes or skin lesions Psychiatric: No behavioral problems Sleep: Diagnosed with obstructive sleep apnea in 2016 on CPAP therapy with excellent compliance    Epworth Sleepiness Scale: Situation   Chance of Dozing/Sleeping (0 = never , 1 = slight chance , 2 = moderate chance , 3 = high chance )   sitting and reading 0   watching TV 0   sitting inactive in a public place 0   being a passenger in a motor vehicle for an hour or more 2   lying down in the afternoon 2   sitting and talking to  someone 0   sitting quietly after lunch (no alcohol) 0   while stopped for a few minutes in traffic as the driver 0   Total Score  4      Other comprehensive 14 point system review is negative.   PHYSICAL EXAM:   VS:  BP 130/70 (BP Location: Left Arm)   Pulse 69   Ht 5\' 10"  (1.778 m)   Wt 222 lb 6.4 oz (100.9 kg)   SpO2 96%   BMI 31.91 kg/m     Repeat blood pressure by me 122/70  Wt Readings from Last 3 Encounters:  06/20/20 222 lb 6.4 oz (100.9 kg)  03/04/20 230 lb (104.3 kg)  02/26/20 230 lb (104.3 kg)    General: Alert, oriented, no distress.  Skin: normal turgor, no rashes, warm and dry HEENT: Normocephalic, atraumatic. Pupils equal round and reactive to light; sclera anicteric; extraocular muscles intact;  Nose without nasal septal hypertrophy Mouth/Parynx benign; Mallinpatti scale 3 Neck: No JVD, no carotid bruits; normal carotid upstroke Lungs: clear to ausculatation and percussion; no wheezing or rales Chest wall: without tenderness to palpitation Heart: PMI not displaced, RRR, s1 s2 normal, no systolic murmur, no diastolic murmur, no rubs, gallops, thrills, or heaves Abdomen: soft, nontender; no hepatosplenomehaly, BS+; abdominal aorta nontender and not dilated by palpation. Back: no CVA tenderness Pulses 2+ Musculoskeletal: full range of motion, normal strength, no joint deformities Extremities: no clubbing cyanosis or edema, Homan's sign negative  Neurologic: grossly nonfocal; Cranial nerves grossly wnl Psychologic: Normal mood and affect   Studies/Labs Reviewed:   EKG:  EKG is not ordered today.  I personally reviewed his ECG from February 19, 2020 which showed normal sinus rhythm at 66 bpm.  Normal intervals.  Recent Labs: BMP Latest Ref Rng & Units 02/26/2020  Glucose 70 - 99 mg/dL 92  BUN 8 - 23 mg/dL 17  Creatinine 0.61 - 1.24 mg/dL 0.88  Sodium 135 - 145 mmol/L 139  Potassium 3.5 - 5.1 mmol/L 4.1  Chloride  98 - 111 mmol/L 108  CO2 22 - 32 mmol/L  21(L)  Calcium 8.9 - 10.3 mg/dL 9.0     No flowsheet data found.  CBC Latest Ref Rng & Units 02/26/2020 01/15/2010 10/26/2007  WBC 4.0 - 10.5 K/uL 5.3 - -  Hemoglobin 13.0 - 17.0 g/dL 15.6 15.8 16.3 POINT OF CARE RESULT  Hematocrit 39.0 - 52.0 % 46.4 - -  Platelets 150 - 400 K/uL 246 - -   Lab Results  Component Value Date   MCV 96.1 02/26/2020   No results found for: TSH No results found for: HGBA1C   BNP No results found for: BNP  ProBNP No results found for: PROBNP   Lipid Panel  No results found for: CHOL, TRIG, HDL, CHOLHDL, VLDL, LDLCALC, LDLDIRECT, LABVLDL   RADIOLOGY: No results found.   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  He admits to loud snoring, witnessed apnea, frequent awakenings and nonrestorative sleep.   EPWORTH SLEEPINESS SCORE:  7 HEIGHT: 5\' 10"  (177.8 cm)  WEIGHT: 205 lb (92.987 kg)    Body mass index is 29.41 kg/(m^2).  NECK SIZE: 17 in.   MEDICATIONS: venlafaxine (EFFEXOR) 75 MG tablet 75 mg, 2 times daily buPROPion (WELLBUTRIN XL) 150 MG 24 hr tablet 150     SLEEP ARCHITECTURE:  On the diagnostic portion of the study the patient slept for 126.5 minutes out of a sleep period of time of 157.5 minutes.  Sleep efficiency was mildly reduced at 76.7%.  Sleep onset was 7.5 minutes.  Latency to REM sleep was prolonged at 144 minutes.  The patient slept for 45 minutes (38.3%) in stage I, 64.5 minutes (51%) in stage II, 0 minutes in stage III, and 13.5 minutes (10.7%), in REM sleep.  He spent 91 minutes (71.9%) in supine sleep and 35.5 minutes (28.1%) in nonsupine sleep.  Sleep architecture is abnormal with reduced slow wave sleep and delayed latency to REM sleep.  There were a  total of 15 arousals with an index of 7.1 per hour. There was very loud snoring.   On the titration portion of the study, he slept 10.5 minutes in stage I (5.5%), 53.5 minutes in stage II, and (28.2%), 0 minutes in stage III sleep, and 126 minutes (66.3%) in REM sleep.  This high percentage of REM sleep suggests REM rebound.  There was one arousal with an index of 0.3 per hour.  With CPAP therapy, there was complete resolution of snoring     RESPIRATORY DATA:  On the diagnostic portion of the study, there were 45 obstructive apneas, 0 central apneas, 0 mixed apneas, and 32 hypopneas.  The apnea hypopnea index (AHI) was 36.5 per hour overall and during REM sleep was 75.6 per hour.  This places the patient into the severe sleep apnea category.  There was a significant positional component with the AHI during supine sleep at 50.1 per hour and the AHI with nonsupine sleep at 1.7 per hour.   CPAP titration was started at 4 cm water pressure and increased to 14 cm water pressure.  The AHI at 14 cm water pressure was 0.  REM supine sleep was achieved throughout the titration.  OXYGEN DATA:  The baseline oxygen saturation was 96%.  The nadir with non-REM sleep was 83% and with REM sleep was 85%.  The lowest oxygen desaturation during CPAP therapy was 84% at 5 cm water pressure.   CARDIAC DATA: The patient was in sinus rhythm with an average heart rate at 74 bpm.  There were rare isolated unifocal PVCs.   MOVEMENT/PARASOMNIA: There were 0 periodic limb movements.   IMPRESSION/ RECOMMENDATION:   Severe obstructive sleep apnea/hypoxia syndrome.  Events were worse in the supine position and during REM sleep. Respiratory events with oxygen desaturation to a nadir of 83% with non-REM sleep. Loud snoring on the baseline portion of the study with complete resolution with CPAP therapy. Abnormal sleep architecture related to respiratory events. Reduced REM sleep on the diagnostic portion of the study and  "REM rebound" with CPAP therapy.   Recommend initial use of CPAP with EPR of 3 at 14 cm water pressure with a heated humidifier.  An F&P Simplus FFM size medium was used for the CPAP titration.  Recommend attempt at trying to avoid sleep in the supine position.  Recommend a download be obtained in 30 days and sleep clinic follow-up.    Aug 2019: monitoring Sinus rhythm Single episode of nonsustained ventricular tachycardia (6 beats) Frequent nonsustained ectopic atrial tachycardia Atrial fibrillation noted 08/19/17 at 3:25 am   08/11/2017: TTE Study Conclusions  - Left ventricle: The cavity size was normal. Systolic function was    normal. The estimated ejection fraction was in the range of 55%    to 60%. Wall motion was normal; there were no regional wall    motion abnormalities.  - Aortic valve: Transvalvular velocity was within the normal range.    There was no stenosis. There was no regurgitation.  - Mitral valve: Transvalvular velocity was within the normal range.    There was no evidence for stenosis. There was no regurgitation.  - Right ventricle: The cavity size was normal. Wall thickness was    normal. Systolic function was normal.  - Atrial septum: No defect or patent foramen ovale was identified.  - Tricuspid valve: There was no regurgitation.  - Pericardium, extracardiac: A trivial pericardial effusion was    identified.     ASSESSMENT:    1. OSA (obstructive sleep apnea)   2. 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 In 2016 due to complaints of loud snoring, frequent awakenings, nonrestorative sleep, and witness apnea he underwent a sleep study which confirmed a diagnosis of severe sleep apnea with an overall AHI of 36.5/h and which was very severe during REM sleep with an AHI of 75.6/h.  On that split-night protocol, during the CPAP titration he had significant REM   rebound and felt significantly improved in the morning following CPAP therapy.  Over the past 6 years he has been 100% compliant with CPAP use.  His ResMed air sense 10 AutoSet unit has recently given a message that the motor is ending year of life.  His current CPAP download continues to show excellent compliance with average use of 10 hours per night.  His AHI is excellent at 1.4/h and his 95th percentile pressure is 10.4 with a maximum average pressure of 11.6 cm of water.  We will submit a prescription for a new ResMed AirSense 11 AutoSet CPAP unit.  I discussed with him that unfortunately there is a back order due to  supply chain issues and it may take several months before his machine can be received.  During this visit I had lengthy discussion with him and reviewed his initial sleep study as well as discussed with him the effects of sleep apnea on normal sleep architecture and potential adverse cardiovascular consequences if left untreated.  He is not aware of any further episodes of atrial tachycardia or PAF.  I also discussed with him the pathophysiology associated with increased nocturia.  Presently he is doing exceptionally well.  His blood pressure is stable.  He was not found to have any coronary calcification on his most recent cardiac calcium score.  His echo Doppler study has shown normal LV function.  He has not had any recurrence of atrial fibrillation since his initial episode in 2019.  Once he receives his new machine I will set his AutoPap at a range of 8-16 centimeters of water with EPR of 3 based on his most recent download.  Compliance data will need to be obtained within 90 days of receiving his new machine.  Medication Adjustments/Labs and Tests Ordered: Current medicines are reviewed at length with the patient today.  Concerns regarding medicines are outlined above.  Medication changes, Labs and Tests ordered today are listed in the Patient Instructions below. Patient Instructions   Medication Instructions:  Continue current medications  *If you need a refill on your cardiac medications before your next appointment, please call your pharmacy*   Lab Work: None Ordered   Testing/Procedures: None Ordered   Follow-Up: At [facility], you and your health needs are our priority.  As part of our continuing mission to provide you with exceptional heart care, we have created designated Provider Care Teams.  These Care Teams include your primary Cardiologist (physician) and Advanced Practice Providers (APPs -  Physician Assistants and Nurse Practitioners) who all work together to provide you with the care you need, when you need it.  We recommend signing up for the patient portal called "MyChart".  Sign up information is provided on this After Visit Summary.  MyChart is used to connect with patients for Virtual Visits (Telemedicine).  Patients are able to view lab/test results, encounter notes, upcoming appointments, etc.  Non-urgent messages can be sent to your provider as well.   To learn more about what you can do with MyChart, go to [email or web address].    Your next appointment:   1 year(s)  The format for your next appointment:   In Person  Provider:   You may see Shelva Majestic, [name] or one of the following Advanced Practice Providers on your designated Care Team:   Almyra Deforest, PA-C Fabian Sharp, PA-C or  Roby Lofts, [place]     Signed, Shelva Majestic, [name]  06/22/2020 3:42 PM    [facility] [address] [place], [place], [place]  [address] Phone: [phone]

## 2021-02-03 DIAGNOSIS — E7800 Pure hypercholesterolemia, unspecified: Secondary | ICD-10-CM | POA: Insufficient documentation

## 2021-02-03 DIAGNOSIS — F320 Major depressive disorder, single episode, mild: Secondary | ICD-10-CM | POA: Insufficient documentation

## 2021-02-03 NOTE — Progress Notes (Signed)
[place] Phone: [phone] Subjective:   Tyrone [name], am serving as a scribe for [name]. This visit occurred during the SARS-CoV-2 public health emergency.  Safety protocols were in place, including screening questions prior to the visit, additional usage of staff PPE, and extensive cleaning of exam room while observing appropriate contact time as indicated for disinfecting solutions.   I'm seeing this patient by the request  of:  Tyrone [name], L.Tyrone [name], Tyrone [name]  CC: Low back pain  CBU:[id number]  Tyrone [name], Tyrone [name] is a 71 y.o. male coming in with complaint of LBP on right. Pushed up attic stairs and 2 days later had R sided lower back pain. Sitting is comfortable but standing and walking exacerbate his pain. Sleeps in recliner to reduce pain. History of laminectomy in lumbar spine-1974, 1977, and C spine in 1988.  Patient states on a regular basis he does not have any true back pain at all.  Patient denies any numbness in the legs, denies any bowel or bladder incontinence.   Patient did undergo a total hip arthroplasty in March 2022.   MRI of the lumbar in 2006 was independently visualized by me.  Patient did have what appeared to be a potential epidural hematoma it appeared at the L3-L4 level.  Chronic findings though do show moderate to severe degenerative disc disease at every level of the lumbar spine.  Postsurgical changes also noted at L4-L5 and L5-S1.  There was some signs of spinal stenosis and adjacent segment disease at the L3-L4.  Past Medical History:  Diagnosis Date   Adenomatous polyp of colon    Arthritis    Contact lens/glasses fitting    wears contacts or glasses   Depression    Diverticulitis of colon 06/2010   recurrent   Dysrhythmia    Afib   Hyperlipidemia    Obstructive sleep apnea    uses CPAP, followed by Dr Tyrone [name]   Wears hearing aid    both ears   Past Surgical History:   Procedure Laterality Date   CERVICAL LAMINECTOMY  1992   COLONOSCOPY     KNEE ARTHROSCOPY  2012   bilateral   LESION REMOVAL N/A 02/06/2013   Procedure: MINOR EXICISION OF SCALP LESION;  Surgeon: Tyrone [name], Tyrone [name];  Location: [name];  Service: General;  Laterality: N/A;   LUMBAR LAMINECTOMY  1974 &1977   ROTATOR CUFF REPAIR  2008   left   SHOULDER ARTHROSCOPY  2008   right   TOTAL HIP ARTHROPLASTY Right 03/04/2020   Procedure: RIGHT TOTAL HIP ARTHROPLASTY ANTERIOR APPROACH;  Surgeon: Tyrone [name], Tyrone [name];  Location: [name];  Service: Orthopedics;  Laterality: Right;   Social History   Socioeconomic History   Marital status: Married    Spouse name: Not on file   Number of children: Not on file   Years of education: Not on file   Highest education level: Not on file  Occupational History   Occupation: Radiologist    Employer: [name]    Comment: Radiologist - [facility] [name]  Tobacco Use   Smoking status: Never   Smokeless tobacco: Never  Vaping Use   Vaping Use: Never used  Substance and Sexual Activity   Alcohol use: Yes    Alcohol/week: 7.0 standard drinks    Types: 7 Glasses of wine per week    Comment: soc.   Drug use: [name]   Sexual activity: Not on  file  Other Topics Concern   Not on file  Social History Narrative   Lives with spouse in [place].  Radiologist for Tyrone [name]   Social Determinants of Health   Financial Resource Strain: Not on file  Food Insecurity: Not on file  Transportation Needs: Not on file  Physical Activity: Not on file  Stress: Not on file  Social Connections: Not on file   [name] Known Allergies Family History  Problem Relation Age of Onset   Colon cancer Father    Colon cancer Maternal Grandmother     Current Outpatient Medications (Endocrine & Metabolic):    predniSONE (DELTASONE) 20 MG tablet, Take 2 tablets (40 mg total) by mouth daily with breakfast.    Current Outpatient Medications  (Analgesics):    traMADol (ULTRAM) 50 MG tablet, Take 1 tablet (50 mg total) by mouth every 12 (twelve) hours as needed for up to 5 days.   Current Outpatient Medications (Other):    buPROPion (WELLBUTRIN XL) 150 MG 24 hr tablet, Take 1 tablet by mouth every morning. Patient taking 450mg  daily   DHA-Vitamin C-Lutein (EYE HEALTH FORMULA PO), Take 1 tablet by mouth daily.   gabapentin (NEURONTIN) 100 MG capsule, Take 2 capsules (200 mg total) by mouth at bedtime.   Multiple Vitamins-Minerals (MULTIVITAMIN WITH MINERALS) tablet, Take 1 tablet by mouth daily.   TRINTELLIX 20 MG TABS tablet, Take 20 mg by mouth daily.   Reviewed prior external [name] including notes and imaging from  primary care provider As well as notes that were available from care everywhere and other healthcare systems.  Past medical history, social, surgical and family history all reviewed in electronic medical record.  Tyrone [name] unless stated regarding to the chief complaint.   Review of Systems:  [name] headache, visual changes, nausea, vomiting, diarrhea, constipation, dizziness, abdominal pain, skin rash, fevers, chills, night sweats, weight loss, swollen lymph nodes, body aches, joint swelling, chest pain, shortness of breath, mood changes. POSITIVE muscle aches  Objective  Blood pressure 106/68, pulse 72, height 5\' 10"  (1.778 m), weight 218 lb (98.9 kg), SpO2 98 %.   General: [name] apparent distress alert and oriented x3 mood and affect normal, dressed appropriately.  HEENT: Pupils equal, extraocular movements intact  Respiratory: Patient's speak in full sentences and does not appear short of breath  Cardiovascular: [name] lower extremity edema, non tender, [name] erythema  Gait very minorly antalgic MSK: Low back exam does have some loss of lordosis.  Patient does have loss of lordosis.  Patient does seem fairly comfortable as long as he stays still.  Mild tenderness noted in the paraspinal musculature  mostly at the L3-L4 on the right side.  Patient has negative straight leg test.  Negative FABER test.    Impression and Recommendations:    The above documentation has been reviewed and is accurate and complete Tyrone Pulley, DO

## 2021-02-04 ENCOUNTER — Other Ambulatory Visit: Payer: Self-pay

## 2021-02-04 ENCOUNTER — Ambulatory Visit (INDEPENDENT_AMBULATORY_CARE_PROVIDER_SITE_OTHER): Payer: Medicare Other | Admitting: Family Medicine

## 2021-02-04 ENCOUNTER — Ambulatory Visit (INDEPENDENT_AMBULATORY_CARE_PROVIDER_SITE_OTHER): Payer: Medicare Other

## 2021-02-04 ENCOUNTER — Encounter: Payer: Self-pay | Admitting: Family Medicine

## 2021-02-04 VITALS — BP 106/68 | HR 72 | Ht 70.0 in | Wt 218.0 lb

## 2021-02-04 DIAGNOSIS — M47816 Spondylosis without myelopathy or radiculopathy, lumbar region: Secondary | ICD-10-CM | POA: Insufficient documentation

## 2021-02-04 DIAGNOSIS — M545 Low back pain, unspecified: Secondary | ICD-10-CM | POA: Diagnosis not present

## 2021-02-04 MED ORDER — TRAMADOL HCL 50 MG PO TABS: 50.0000 mg | ORAL_TABLET | Freq: Two times a day (BID) | ORAL | 0 refills | Status: AC | PRN

## 2021-02-04 MED ORDER — METHYLPREDNISOLONE ACETATE 80 MG/ML IJ SUSP
80.0000 mg | Freq: Once | INTRAMUSCULAR | Status: AC
  Administered 2021-02-04: 80 mg via INTRAMUSCULAR

## 2021-02-04 MED ORDER — KETOROLAC TROMETHAMINE 60 MG/2ML IM SOLN
60.0000 mg | Freq: Once | INTRAMUSCULAR | Status: AC
  Administered 2021-02-04: 60 mg via INTRAMUSCULAR

## 2021-02-04 MED ORDER — GABAPENTIN 100 MG PO CAPS: 200.0000 mg | ORAL_CAPSULE | Freq: Every day | ORAL | 0 refills | Status: DC

## 2021-02-04 MED ORDER — PREDNISONE 20 MG PO TABS: 40.0000 mg | ORAL_TABLET | Freq: Every day | ORAL | 0 refills | Status: DC

## 2021-02-04 NOTE — Assessment & Plan Note (Signed)
Patient's pain seems to be worse with any type of exacerbation with any type of extension.  Patient has been sleeping more in the flexed position.  Patient seems to have more pain with extension and I do think it is more of a facet arthropathy and no longer having any true radicular symptoms.  Patient does have known arthritic changes of the back.  We will get new x-rays to further evaluate.  Patient given Toradol and Depo-Medrol today to help with the acute pain.  Prednisone for short course of 40 mg daily.  Patient also given gabapentin for nighttime and will give tramadol for any breakthrough.  Patient will follow-up with me again in 2 to 4 weeks.  Worsening pain consider repeat imaging

## 2021-02-04 NOTE — Patient Instructions (Addendum)
Injections in backside Xray lumbar  Prednisone 40mg  for 5 days Gabapentin at night 200mg  See me in 3 weeks

## 2021-02-22 ENCOUNTER — Encounter: Payer: Self-pay | Admitting: Family Medicine

## 2021-02-25 ENCOUNTER — Ambulatory Visit: Payer: Medicare Other | Admitting: Family Medicine

## 2021-12-14 IMAGING — DX DG CHEST 2V
2 series · 2 of 2 positions shown · non-contrast
Comparison: None.

CLINICAL DATA: Preoperative examination

EXAM:
CHEST - 2 VIEW

[chest pa]
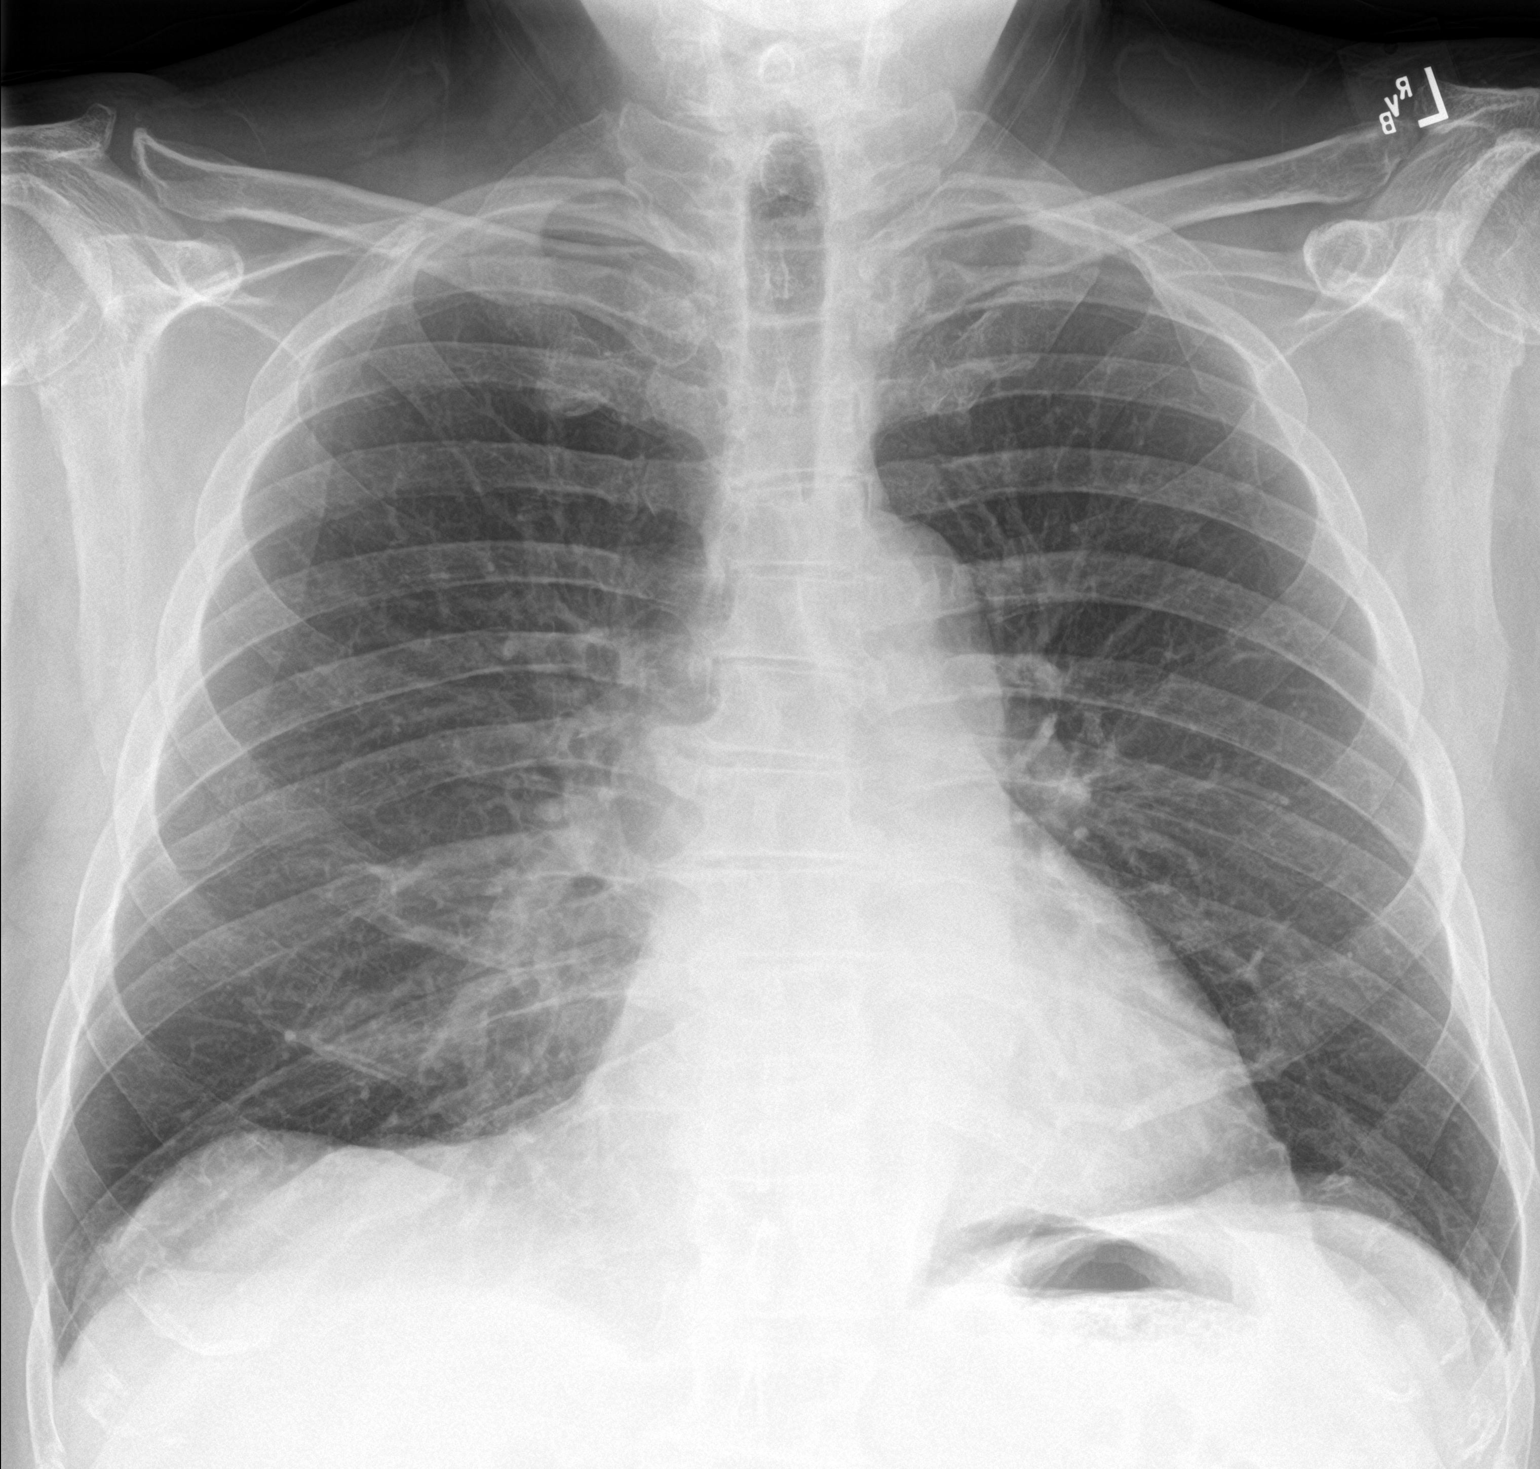

[chest lat]
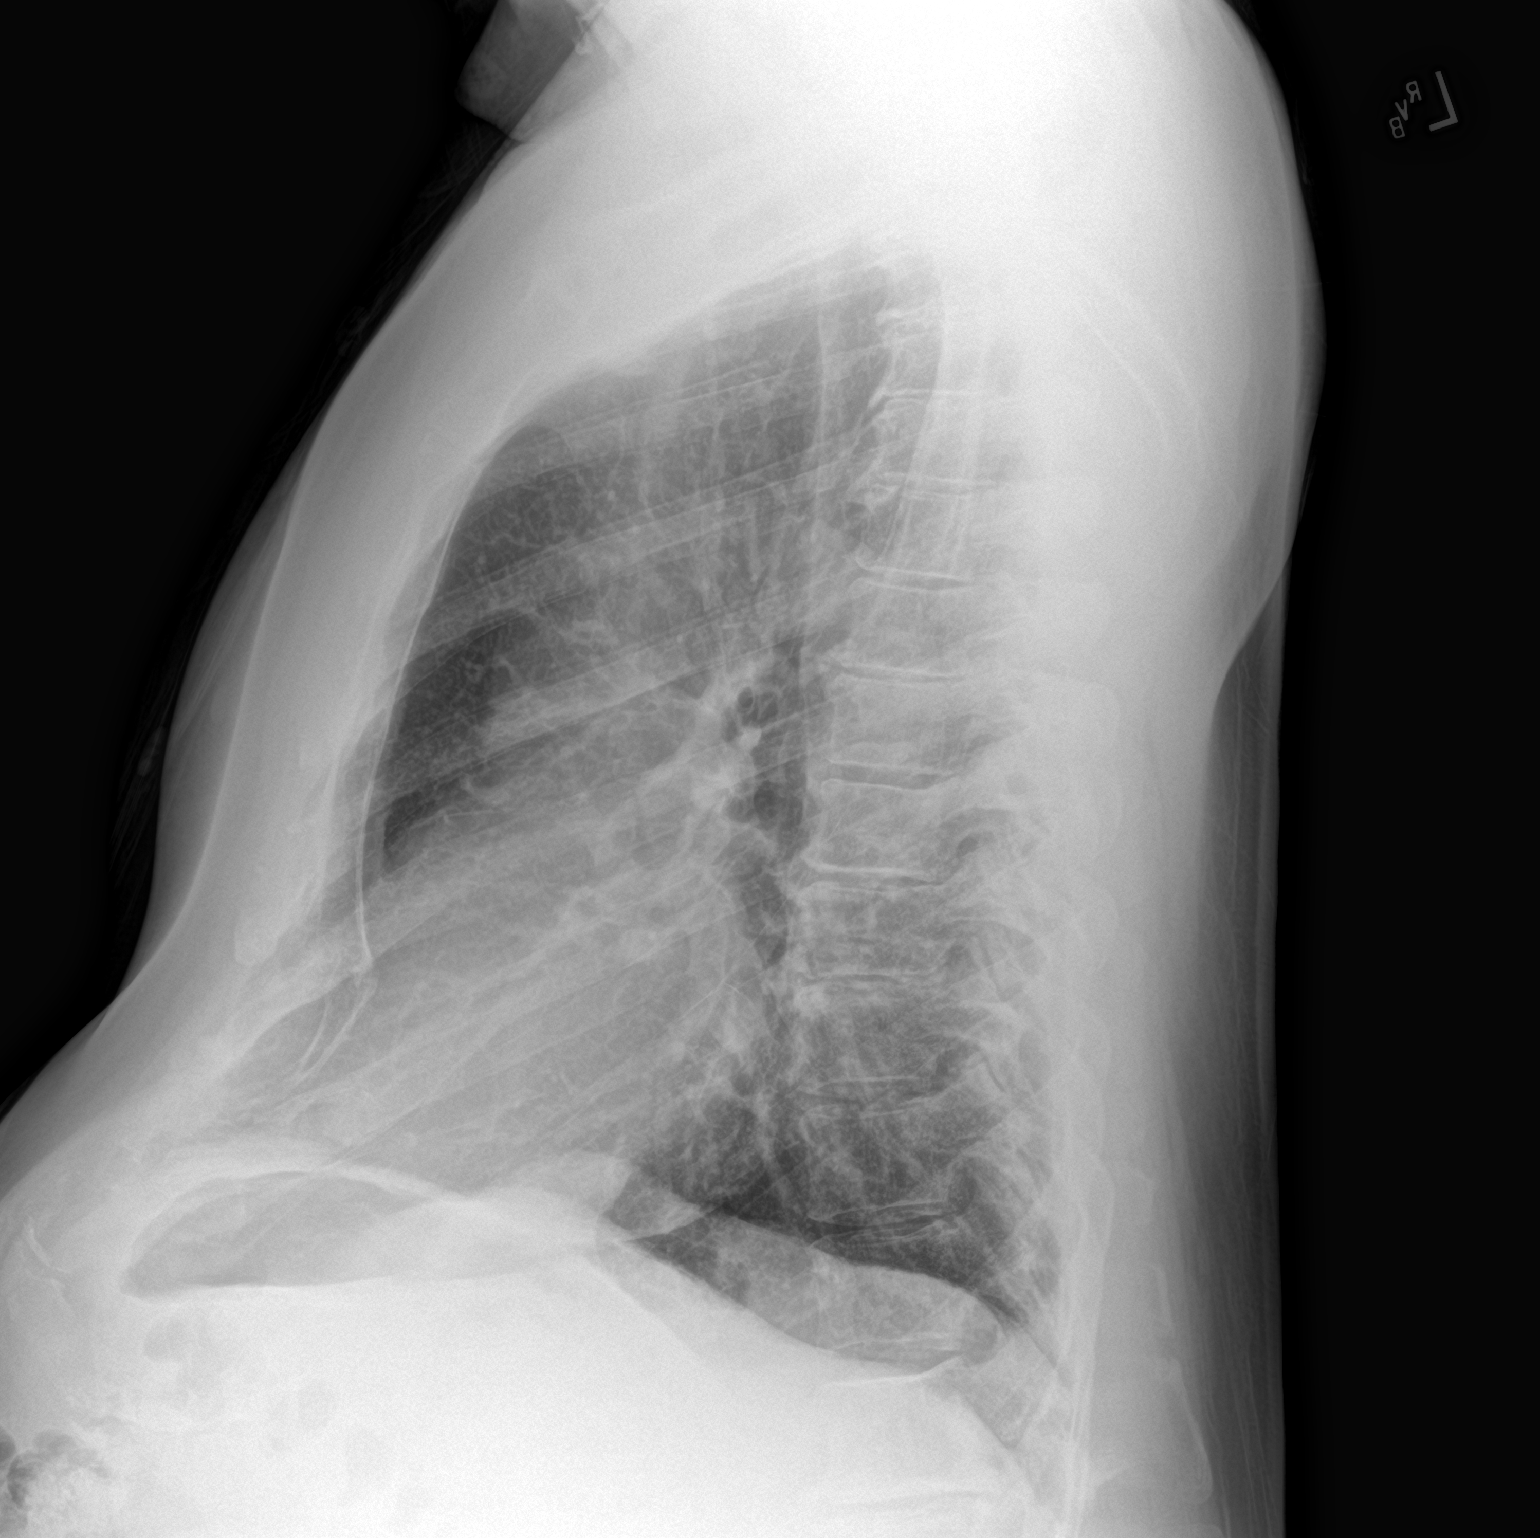

[2 of 2 positions shown; findings below may reference images not displayed]

FINDINGS: The heart size and mediastinal contours are within normal limits. No
focal consolidation. No pleural effusion. No pneumothorax. Thoracic
spondylosis.
IMPRESSION: No active cardiopulmonary disease.

## 2022-08-25 ENCOUNTER — Other Ambulatory Visit: Payer: Self-pay | Admitting: Oncology

## 2022-08-25 DIAGNOSIS — Z006 Encounter for examination for normal comparison and control in clinical research program: Secondary | ICD-10-CM

## 2022-10-25 ENCOUNTER — Other Ambulatory Visit (HOSPITAL_COMMUNITY)
Admission: RE | Admit: 2022-10-25 | Discharge: 2022-10-25 | Disposition: A | Discharge status: Home / Self Care | Payer: Medicare Other | Source: Ambulatory Visit | Attending: Oncology | Admitting: Oncology

## 2022-10-25 DIAGNOSIS — Z006 Encounter for examination for normal comparison and control in clinical research program: Secondary | ICD-10-CM | POA: Insufficient documentation

## 2022-11-02 LAB — HELIX MOLECULAR SCREEN: Genetic Analysis Overall Interpretation: NEGATIVE

## 2022-11-02 LAB — GENECONNECT MOLECULAR SCREEN

## 2023-01-07 ENCOUNTER — Other Ambulatory Visit: Payer: Self-pay

## 2023-01-07 DIAGNOSIS — G4733 Obstructive sleep apnea (adult) (pediatric): Secondary | ICD-10-CM

## 2023-01-10 NOTE — Addendum Note (Signed)
 Addended by: Brunetta Genera on: 01/10/2023 11:36 AM   Modules accepted: Orders

## 2023-01-10 NOTE — Addendum Note (Signed)
 Addended by: Brunetta Genera on: 01/10/2023 11:30 AM   Modules accepted: Orders

## 2023-04-06 ENCOUNTER — Other Ambulatory Visit: Payer: Self-pay | Admitting: Family Medicine

## 2023-04-06 DIAGNOSIS — M79606 Pain in leg, unspecified: Secondary | ICD-10-CM

## 2023-04-14 ENCOUNTER — Ambulatory Visit
Admission: RE | Admit: 2023-04-14 | Discharge: 2023-04-14 | Disposition: A | Discharge status: Home / Self Care | Source: Ambulatory Visit | Attending: Family Medicine | Admitting: Family Medicine

## 2023-04-14 DIAGNOSIS — M79606 Pain in leg, unspecified: Secondary | ICD-10-CM

## 2023-04-14 MED ORDER — GADOPICLENOL 0.5 MMOL/ML IV SOLN
9.0000 mL | Freq: Once | INTRAVENOUS | Status: AC | PRN
  Administered 2023-04-14: 9 mL via INTRAVENOUS

## 2023-05-17 ENCOUNTER — Other Ambulatory Visit: Payer: Self-pay | Admitting: Orthopaedic Surgery

## 2023-05-24 ENCOUNTER — Encounter (HOSPITAL_COMMUNITY): Payer: Self-pay

## 2023-05-24 ENCOUNTER — Encounter (HOSPITAL_COMMUNITY)
Admission: RE | Admit: 2023-05-24 | Discharge: 2023-05-24 | Disposition: A | Discharge status: Home / Self Care | Source: Ambulatory Visit | Attending: Orthopaedic Surgery | Admitting: Orthopaedic Surgery

## 2023-05-24 ENCOUNTER — Ambulatory Visit (HOSPITAL_COMMUNITY)
Admission: RE | Admit: 2023-05-24 | Discharge: 2023-05-24 | Disposition: A | Discharge status: Home / Self Care | Source: Ambulatory Visit | Attending: Orthopaedic Surgery | Admitting: Orthopaedic Surgery

## 2023-05-24 ENCOUNTER — Other Ambulatory Visit: Payer: Self-pay

## 2023-05-24 VITALS — BP 129/78 | HR 62 | Temp 97.6°F | Resp 16 | Ht 70.0 in | Wt 200.0 lb

## 2023-05-24 DIAGNOSIS — Z01818 Encounter for other preprocedural examination: Secondary | ICD-10-CM | POA: Diagnosis not present

## 2023-05-24 HISTORY — DX: Anxiety disorder, unspecified: F41.9

## 2023-05-24 HISTORY — DX: Personal history of urinary calculi: Z87.442

## 2023-05-24 HISTORY — DX: Malignant (primary) neoplasm, unspecified: C80.1

## 2023-05-24 LAB — TYPE AND SCREEN
ABO/RH(D): A POS
Antibody Screen: NEGATIVE

## 2023-05-24 LAB — CBC
HCT: 46.6 % (ref 39.0–52.0)
Hemoglobin: 15.5 g/dL (ref 13.0–17.0)
MCH: 31.8 pg (ref 26.0–34.0)
MCHC: 33.3 g/dL (ref 30.0–36.0)
MCV: 95.5 fL (ref 80.0–100.0)
Platelets: 273 10*3/uL (ref 150–400)
RBC: 4.88 MIL/uL (ref 4.22–5.81)
RDW: 13.9 % (ref 11.5–15.5)
WBC: 5.3 10*3/uL (ref 4.0–10.5)
nRBC: 0 % (ref 0.0–0.2)

## 2023-05-24 LAB — BASIC METABOLIC PANEL WITH GFR
Anion gap: 9 (ref 5–15)
BUN: 16 mg/dL (ref 8–23)
CO2: 22 mmol/L (ref 22–32)
Calcium: 9.5 mg/dL (ref 8.9–10.3)
Chloride: 108 mmol/L (ref 98–111)
Creatinine, Ser: 1.02 mg/dL (ref 0.61–1.24)
GFR, Estimated: >60 mL/min (ref >60)
Glucose, Bld: 96 mg/dL (ref 70–99)
Potassium: 4.3 mmol/L (ref 3.5–5.1)
Sodium: 139 mmol/L (ref 135–145)

## 2023-05-24 LAB — SURGICAL PCR SCREEN
MRSA, PCR: NEGATIVE
Staphylococcus aureus: NEGATIVE

## 2023-05-24 NOTE — Progress Notes (Addendum)
 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

## 2023-05-24 NOTE — Patient Instructions (Signed)
 SURGICAL WAITING ROOM VISITATION  Patients having surgery or a procedure may have no more than 2 support people in the waiting area - these visitors may rotate.    Children under the age of 19 must have an adult with them who is not the patient.    Visitors with respiratory illnesses are discouraged from visiting and should remain at home.  If the patient needs to stay at the hospital during part of their recovery, the visitor guidelines for inpatient rooms apply. Pre-op nurse will coordinate an appropriate time for 1 support person to accompany patient in pre-op.  This support person may not rotate.    Please refer to the [facility] website for the visitor guidelines for Inpatients (after your surgery is over and you are in a regular room).       Your procedure is scheduled on:  06/07/2023    Report to [facility] Main Entrance    Report to admitting at  0515 AM   Call this number if you have problems the morning of surgery [phone]   Do not eat food :After Midnight.   After Midnight you may have the following liquids until _ 0430_____ AM  DAY OF SURGERY  Water  Non-Citrus Juices (without pulp, NO RED-Apple, White grape, White cranberry) Black Coffee (NO MILK/CREAM OR CREAMERS, sugar ok)  Clear Tea (NO MILK/CREAM OR CREAMERS, sugar ok) regular and decaf                             Plain Jell-O (NO RED)                                           Fruit ices (not with fruit pulp, NO RED)                                     Popsicles (NO RED)                                                               Sports drinks like Gatorade (NO RED)                   The day of surgery:  Drink ONE (1) Pre-Surgery Clear Ensure or G2 at 0430 AM ( have completed by )  the morning of surgery. Drink in one sitting. Do not sip.  This drink was given to you during your hospital  pre-op appointment visit. Nothing else to drink after completing the  Pre-Surgery Clear Ensure or G2.           If you have questions, please contact your surgeon's office.       Oral Hygiene is also important to reduce your risk of infection.                                    Remember - BRUSH YOUR TEETH THE MORNING OF SURGERY WITH YOUR REGULAR TOOTHPASTE  DENTURES WILL BE REMOVED PRIOR TO SURGERY  PLEASE DO NOT APPLY "Poly grip" OR ADHESIVES!!!   Do NOT smoke after Midnight   Stop all vitamins and herbal supplements 7 days before surgery.   Take these medicines the morning of surgery with A SIP OF WATER :  wellbutrin ditropan     Bring CPAP mask and tubing day of surgery.                              You may not have any metal on your body including hair pins, jewelry, and body piercing             Do not wear make-up, lotions, powders, perfumes/cologne, or deodorant               Men may shave face and neck.   Do not bring valuables to the hospital. [facility] IS NOT             RESPONSIBLE   FOR VALUABLES.   Contacts, glasses, dentures or bridgework may not be worn into surgery.   Bring small overnight bag day of surgery.   DO NOT BRING YOUR HOME MEDICATIONS TO THE HOSPITAL. PHARMACY WILL DISPENSE MEDICATIONS LISTED ON YOUR MEDICATION LIST TO YOU DURING YOUR ADMISSION IN THE HOSPITAL!    Patients discharged on the day of surgery will not be allowed to drive home.  Someone NEEDS to stay with you for the first 24 hours after anesthesia.   Special Instructions: Bring a copy of your healthcare power of attorney and living will documents the day of surgery if you haven't scanned them before.              Please read over the following fact sheets you were given: IF YOU HAVE QUESTIONS ABOUT YOUR PRE-OP INSTRUCTIONS PLEASE CALL [phone]   If you received a COVID test during your pre-op visit  it is requested that you wear a mask when out in public, stay away from anyone that may not be feeling well and notify your surgeon if you develop symptoms. If you test positive for  Covid or have been in contact with anyone that has tested positive in the last 10 days please notify you surgeon.      Pre-operative 5 CHG Bath Instructions   You can play a key role in reducing the risk of infection after surgery. Your skin needs to be as free of germs as possible. You can reduce the number of germs on your skin by washing with CHG (chlorhexidine  gluconate) soap before surgery. CHG is an antiseptic soap that kills germs and continues to kill germs even after washing.   DO NOT use if you have an allergy to chlorhexidine /CHG or antibacterial soaps. If your skin becomes reddened or irritated, stop using the CHG and notify one of our RNs at [phone].   Please shower with the CHG soap starting 4 days before surgery using the following schedule:     Please keep in mind the following:  DO NOT shave, including legs and underarms, starting the day of your first shower.   You may shave your face at any point before/day of surgery.  Place clean sheets on your bed the day you start using CHG soap. Use a clean washcloth (not used since being washed) for each shower. DO NOT sleep with pets once you start using the CHG.   CHG Shower Instructions:  If you choose to wash your hair and private area, wash first  with your normal shampoo/soap.  After you use shampoo/soap, rinse your hair and body thoroughly to remove shampoo/soap residue.  Turn the water  OFF and apply about 3 tablespoons (45 ml) of CHG soap to a CLEAN washcloth.  Apply CHG soap ONLY FROM YOUR NECK DOWN TO YOUR TOES (washing for 3-5 minutes)  DO NOT use CHG soap on face, private areas, open wounds, or sores.  Pay special attention to the area where your surgery is being performed.  If you are having back surgery, having someone wash your back for you may be helpful. Wait 2 minutes after CHG soap is applied, then you may rinse off the CHG soap.  Pat dry with a clean towel  Put on clean clothes/pajamas   If you choose  to wear lotion, please use ONLY the CHG-compatible lotions on the back of this paper.     Additional instructions for the day of surgery: DO NOT APPLY any lotions, deodorants, cologne, or perfumes.   Put on clean/comfortable clothes.  Brush your teeth.  Ask your nurse before applying any prescription medications to the skin.      CHG Compatible Lotions   Aveeno Moisturizing lotion  Cetaphil Moisturizing Cream  Cetaphil Moisturizing Lotion  Clairol Herbal Essence Moisturizing Lotion, Dry Skin  Clairol Herbal Essence Moisturizing Lotion, Extra Dry Skin  Clairol Herbal Essence Moisturizing Lotion, Normal Skin  Curel Age Defying Therapeutic Moisturizing Lotion with Alpha Hydroxy  Curel Extreme Care Body Lotion  Curel Soothing Hands Moisturizing Hand Lotion  Curel Therapeutic Moisturizing Cream, Fragrance-Free  Curel Therapeutic Moisturizing Lotion, Fragrance-Free  Curel Therapeutic Moisturizing Lotion, Original Formula  Eucerin Daily Replenishing Lotion  Eucerin Dry Skin Therapy Plus Alpha Hydroxy Crme  Eucerin Dry Skin Therapy Plus Alpha Hydroxy Lotion  Eucerin Original Crme  Eucerin Original Lotion  Eucerin Plus Crme Eucerin Plus Lotion  Eucerin TriLipid Replenishing Lotion  Keri Anti-Bacterial Hand Lotion  Keri Deep Conditioning Original Lotion Dry Skin Formula Softly Scented  Keri Deep Conditioning Original Lotion, Fragrance Free Sensitive Skin Formula  Keri Lotion Fast Absorbing Fragrance Free Sensitive Skin Formula  Keri Lotion Fast Absorbing Softly Scented Dry Skin Formula  Keri Original Lotion  Keri Skin Renewal Lotion Keri Silky Smooth Lotion  Keri Silky Smooth Sensitive Skin Lotion  Nivea Body Creamy Conditioning Oil  Nivea Body Extra Enriched Teacher, adult education Moisturizing Lotion Nivea Crme  Nivea Skin Firming Lotion  NutraDerm 30 Skin Lotion  NutraDerm Skin Lotion  NutraDerm Therapeutic Skin Cream  NutraDerm Therapeutic  Skin Lotion  ProShield Protective Hand Cream  Provon moisturizing lotion

## 2023-05-25 NOTE — Progress Notes (Signed)
 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  Past medical history significant for PAF, NSVT, severe OSA (uses CPAP), arthritis, anxiety, depression  Patient previously followed with cardiology for history of A-fib and OSA.  He had evaluation by EP in 2019 for A-fib.  He had a cardiac monitor placed which showed nocturnal A-fib.  He had a low chads 2 Vasc score and anticoagulation was not recommended at that time.  Patient was last seen by Dr. Loetta Ringer in 2022 for OSA. He uses a CPAP. Advised to f/u in 1 year but has been lost to f/u.   VS: BP 129/78   Pulse 62   Temp 36.4 C (Oral)   Resp 16   Ht 5\' 10"  (1.778 m)   Wt 90.7 kg   SpO2 96%   BMI 28.70 kg/m   PROVIDERS: Benedetto Brady, MD   LABS: Labs reviewed: Acceptable for surgery. (all labs ordered are listed, but only abnormal results are displayed)  Labs Reviewed  SURGICAL PCR SCREEN  BASIC METABOLIC PANEL WITH GFR  CBC  TYPE AND SCREEN     IMAGES: CXR 05/24/23:  FINDINGS: Cardiomediastinal silhouette unchanged in size and contour. No evidence of central vascular congestion. No interlobular septal thickening.   No pneumothorax or pleural effusion.  No confluent airspace disease.   No acute displaced fracture. Degenerative changes of the spine.   IMPRESSION: No active cardiopulmonary disease.   MRI Lumbar spine 04/14/23:   IMPRESSION: 1. Scoliotic curvature convex to the left at the thoracolumbar junction and to the right in the lower lumbar region. Leftward translation of L2 relative to L3 of 1 cm. Loss of the normal lumbar lordosis. Retrolisthesis at L5-S1 of 8  mm. 2. T12-L1: Marked worsening of disc degeneration since 2006. Discogenic endplate marrow edema which could cause regional pain. Stenosis of the right lateral recess and foramen on the right that could possibly be symptomatic. 3. L1-2: Stenosis of the right lateral recess and intervertebral foramen on the right that could cause neural compression of the right L1 and or L2 nerves. This has worsened since 2006. 4. L2-3: Stenosis of both lateral recesses and foramina that could cause neural compression on either or both sides at this level. This has worsened since 2006. 5. L3-4: Development of auto fusion across this disc level. Endplate osteophytes and bulging of the disc more prominent towards the left. Facet and ligamentous hypertrophy. Stenosis of both lateral recesses and foramina, left more than right. There is potential for neural compression at this level, particularly in the left lateral recess which could affect the L4 nerve. However, because of the appearance of fusion across the disc space, ongoing motion would be unlikely. 6. L4-5: Mild stenosis of the lateral recesses but no likely neural compression. Previous partial hemilaminectomy on the left at this level. No apparent change. 7. L5-S1: 8 mm of retrolisthesis. Stenosis of both subarticular lateral recesses, worse on the right than the left. Bilateral foraminal narrowing. Some potential the right S1 nerve could be affected. Either L5 nerve could possibly be irritated due to the foraminal stenosis but definite L5 nerve compression is not demonstrated.  Slight worsening since 2006.   EKG 05/24/23:  Normal sinus rhythm, rate 65 Nonspecific ST and T wave abnormality  CV:  CT Calcium score 04/22/2020:  IMPRESSION: Coronary calcium score of 0.   Cardiac monitoring 08/31/2017:  Sinus rhythm Single episode of nonsustained ventricular tachycardia (6 beats) Frequent nonsustained ectopic atrial tachycardia Atrial  fibrillation noted 08/19/17 at 3:25 am     08/11/2017: TTE Study Conclusions  - Left ventricle: The cavity size was normal. Systolic function was    normal. The estimated ejection fraction was in the range of 55%    to 60%. Wall motion was normal; there were no regional wall    motion abnormalities.  - Aortic valve: Transvalvular velocity was within the normal range.    There was no stenosis. There was no regurgitation.  - Mitral valve: Transvalvular velocity was within the normal range.    There was no evidence for stenosis. There was no regurgitation.  - Right ventricle: The cavity size was normal. Wall thickness was    normal. Systolic function was normal.  - Atrial septum: No defect or patent foramen ovale was identified.  - Tricuspid valve: There was no regurgitation.  - Pericardium, extracardiac: A trivial pericardial effusion was    identified.     Past Medical History:  Diagnosis Date   Adenomatous polyp of colon    Anxiety    Arthritis    Cancer (HCC)    melanoma removal on right arm   Contact lens/glasses fitting    wears contacts or glasses   Depression    Diverticulitis of colon 06/2010   recurrent   Dysrhythmia    Afib   History of kidney stones    Hyperlipidemia    Obstructive sleep apnea    uses CPAP, followed by Dr Loetta Ringer   Wears hearing aid    both ears    Past Surgical History:  Procedure Laterality Date   CERVICAL LAMINECTOMY  1992   COLONOSCOPY     KNEE ARTHROSCOPY  2012   bilateral   LESION REMOVAL N/A 02/06/2013   Procedure: MINOR EXICISION OF SCALP LESION;  Surgeon: Thayne Fine, MD;  Location: [facility];  Service: General;  Laterality: N/A;   LUMBAR LAMINECTOMY  1974 &1977   ROTATOR CUFF REPAIR  2008   left   SHOULDER ARTHROSCOPY  2008   right   TOTAL HIP ARTHROPLASTY Right 03/04/2020   Procedure: RIGHT TOTAL HIP ARTHROPLASTY ANTERIOR APPROACH;  Surgeon: Dayne Even, MD;  [facility];  Service: Orthopedics;   Laterality: Right;    MEDICATIONS:  bimatoprost (LUMIGAN) 0.01 % SOLN   buPROPion (WELLBUTRIN XL) 150 MG 24 hr tablet   celecoxib (CELEBREX) 200 MG capsule   HYDROcodone -acetaminophen  (NORCO/VICODIN) 5-325 MG tablet   naproxen sodium (ALEVE) 220 MG tablet   oxybutynin (DITROPAN-XL) 5 MG 24 hr tablet   No current facility-administered medications for this encounter.   Antoinette Kirschner [facility] Short Stay/[facility]est[facility]iology [facility] Phone [phone] 05/25/2023 1:59 PM

## 2023-05-25 NOTE — Anesthesia Preprocedure Evaluation (Addendum)
 Anesthesia Evaluation  Patient identified by MRN, date of birth, ID band Patient awake    Reviewed: Allergy & Precautions, NPO status , Patient's Chart, lab work & pertinent test results  History of Anesthesia Complications Negative for: history of anesthetic complications  Airway Mallampati: III  TM Distance: >3 FB Neck ROM: Limited    Dental  (+) Dental Advisory Given   Pulmonary neg shortness of breath, sleep apnea and Continuous Positive Airway Pressure Ventilation , neg COPD, neg recent URI   Pulmonary exam normal breath sounds clear to auscultation       Cardiovascular (-) angina (-) Past MI, (-) Cardiac Stents and (-) CABG + dysrhythmias (one episode in 2019) Atrial Fibrillation  Rhythm:Regular Rate:Normal  HLD  TTE 08/11/2017: Study Conclusions   - Left ventricle: The cavity size was normal. Systolic function was    normal. The estimated ejection fraction was in the range of 55%    to 60%. Wall motion was normal; there were no regional wall    motion abnormalities.  - Aortic valve: Transvalvular velocity was within the normal range.    There was no stenosis. There was no regurgitation.  - Mitral valve: Transvalvular velocity was within the normal range.    There was no evidence for stenosis. There was no regurgitation.  - Right ventricle: The cavity size was normal. Wall thickness was    normal. Systolic function was normal.  - Atrial septum: No defect or patent foramen ovale was identified.  - Tricuspid valve: There was no regurgitation.  - Pericardium, extracardiac: A trivial pericardial effusion was    identified.     Neuro/Psych neg Seizures PSYCHIATRIC DISORDERS Anxiety Depression     Neuromuscular disease (s/p lumbar laminectomy and cervical surgery)    GI/Hepatic Neg liver ROS,neg GERD  ,,Recurrent diverticulitis   Endo/Other  negative endocrine ROS    Renal/GU negative Renal ROS      Musculoskeletal  (+) Arthritis ,    Abdominal   Peds  Hematology negative hematology ROS (+) Lab Results      Component                Value               Date                      WBC                      5.3                 05/24/2023                HGB                      15.5                05/24/2023                HCT                      46.6                05/24/2023                MCV                      95.5  05/24/2023                PLT                      273                 05/24/2023              Anesthesia Other Findings   Reproductive/Obstetrics                             Anesthesia Physical Anesthesia Plan  ASA: 3  Anesthesia Plan: MAC and Spinal   Post-op Pain Management: Tylenol  PO (pre-op)*   Induction: Intravenous  PONV Risk Score and Plan: 1 and Ondansetron , Dexamethasone , Propofol  infusion, TIVA and Treatment may vary due to age or medical condition  Airway Management Planned: Natural Airway and Simple Face Mask  Additional Equipment:   Intra-op Plan:   Post-operative Plan:   Informed Consent: I have reviewed the patients History and Physical, chart, labs and discussed the procedure including the risks, benefits and alternatives for the proposed anesthesia with the patient or authorized representative who has indicated his/her understanding and acceptance.     Dental advisory given  Plan Discussed with: CRNA and Anesthesiologist  Anesthesia Plan Comments: (See PAT note from 5/20  I have discussed risks of neuraxial anesthesia including but not limited to infection, bleeding, nerve injury, back pain, headache, seizures, and failure of block. Patient denies bleeding disorders and is not currently anticoagulated. Labs have been reviewed. Risks and benefits discussed. All patient's questions answered.   Discussed with patient risks of MAC including, but not limited to, minor pain or discomfort,  hearing people in the room, and possible need for backup general anesthesia. Risks for general anesthesia also discussed including, but not limited to, sore throat, hoarse voice, chipped/damaged teeth, injury to vocal cords, nausea and vomiting, allergic reactions, lung infection, heart attack, stroke, and death. All questions answered. )        Anesthesia Quick Evaluation

## 2023-05-26 NOTE — Care Plan (Signed)
 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 will discharge to home with family to assist. HHPT referral to [facility]. may not need OPPT at that time. he had HHPT with other side and didn't do OPPT at all. discharge instructions discussed and questions answered. Patient and MD in agreement with plan. Choice offered.                     DME Arranged:    DME Agency:     HH Arranged:  PT HH Agency:  [facility]  Additional Comments: Please contact me with any questions of if this plan should need to change.  Cornelia Dieter,  RN,BSN,MHA,CCM  [facility]opaedic [facility]ist  [phone] 05/26/2023, 2:22 PM

## 2023-06-03 NOTE — H&P (Signed)
 TOTAL HIP ADMISSION H&P  Patient is admitted for left total hip arthroplasty.  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  Onset of symptoms was gradual starting 5 years ago with gradually worsening course since that time.The patient noted no past surgery on the left hip(s).  Patient currently rates pain in the left hip at 10 out of 10 with activity. Patient has night pain, worsening of pain with activity and weight bearing, trendelenberg gait, pain that interfers with activities of daily living, crepitus, and joint swelling. Patient has evidence of subchondral cysts, subchondral sclerosis, periarticular osteophytes, and joint space narrowing by imaging studies. This condition presents safety issues increasing the risk of falls. There is no current active infection.  Patient Active Problem List   Diagnosis Date Noted   Facet arthropathy, lumbar 02/04/2021   Mild major depression (HCC) 02/03/2021   Hypercholesterolemia 02/03/2021   Primary osteoarthritis of right hip 03/04/2020   Mass of scalp 4x3x3cm 11/20/2012   Benign lipomatous neoplasm of skin, subcu of right arm 11/20/2012   Benign lipomatous neoplasm of skin and subcutaneous tissue of left arm 11/20/2012   Special screening for malignant neoplasms, colon 09/14/2010   Diverticulosis of colon 09/14/2010   Degenerative disc disease, cervical 02/02/1986   Past Medical History:  Diagnosis Date   Adenomatous polyp of colon    Anxiety    Arthritis    Cancer  (HCC)    melanoma removal on right arm   Contact lens/glasses fitting    wears contacts or glasses   Depression    Diverticulitis of colon 06/2010   recurrent   Dysrhythmia    Afib   History of kidney stones    Hyperlipidemia    Obstructive sleep apnea    uses CPAP, followed by Dr Loetta Ringer   Wears hearing aid    both ears    Past Surgical History:  Procedure Laterality Date   CERVICAL LAMINECTOMY  1992   COLONOSCOPY     KNEE ARTHROSCOPY  2012   bilateral   LESION REMOVAL N/A 02/06/2013   Procedure: MINOR EXICISION OF SCALP LESION;  Surgeon: Thayne Fine, MD;  Location: [facility];  Service: General;  Laterality: N/A;   LUMBAR LAMINECTOMY  1974 &1977   ROTATOR CUFF REPAIR  2008   left   SHOULDER ARTHROSCOPY  2008   right   TOTAL HIP ARTHROPLASTY Right 03/04/2020   Procedure: RIGHT TOTAL HIP ARTHROPLASTY ANTERIOR APPROACH;  Surgeon: Dayne Even, MD;  Location: [facility];  Service: Orthopedics;  Laterality: Right;    No current facility-administered medications for this encounter.   Current Outpatient Medications  Medication Sig Dispense Refill Last Dose/Taking   bimatoprost (LUMIGAN) 0.01 % SOLN Place 2 drops into both eyes at bedtime.   Taking   buPROPion (WELLBUTRIN XL) 150 MG 24 hr tablet Take 450 mg by mouth every morning.   Taking   celecoxib (CELEBREX) 200 MG capsule Take 200 mg by mouth daily as needed.   Taking As Needed   HYDROcodone -acetaminophen  (NORCO/VICODIN) 5-325 MG tablet Take 1 tablet by mouth every 6 (six) hours as needed for  moderate pain (pain score 4-6).   Taking As Needed   naproxen sodium (ALEVE) 220 MG tablet Take 220 mg by mouth daily as needed (Pain).   Taking As Needed   oxybutynin (DITROPAN-XL) 5 MG 24 hr tablet Take 5 mg by mouth daily at 12 noon.   Taking   Allergies  Allergen Reactions   Brexpiprazole Other (See Comments)     jittery, sleepy Rexulti    Social History   Tobacco Use   Smoking status: Never   Smokeless tobacco:  Never  Substance Use Topics   Alcohol use: Yes    Alcohol/week: 7.0 standard drinks of alcohol    Types: 7 Glasses of wine per week    Comment: soc.    Family History  Problem Relation Age of Onset   Colon cancer Father    Colon cancer Maternal Grandmother      Review of Systems  Musculoskeletal:  Positive for arthralgias.       Left hip  All other systems reviewed and are negative.   Objective:  Physical Exam Constitutional:      Appearance: Normal appearance.  HENT:     Head: Normocephalic and atraumatic.     Mouth/Throat:     Pharynx: Oropharynx is clear.  Eyes:     Extraocular Movements: Extraocular movements intact.  Pulmonary:     Effort: Pulmonary effort is normal.  Abdominal:     Palpations: Abdomen is soft.  Musculoskeletal:     Cervical back: Normal range of motion.     Comments: Left hip motion is limited and extremely painful.  Leg lengths are equal.  His right hip moves well without any pain.  He walks with a markedly altered gait.  Sensation and motor function are intact distally with palpable pulses on both sides.  Skin:    General: Skin is warm and dry.  Neurological:     General: No focal deficit present.     Mental Status: He is oriented to person, place, and time.  Psychiatric:        Mood and Affect: Mood normal.        Behavior: Behavior normal.     Vital signs in last 24 hours:    Labs:   Estimated body mass index is 28.7 kg/m as calculated from the following:   Height as of 05/24/23: 5\' 10"  (1.778 m).   Weight as of 05/24/23: 90.7 kg.   Imaging Review Plain radiographs demonstrate severe degenerative joint disease of the left hip(s). The bone quality appears to be good for age and reported activity level.      Assessment/Plan:  End stage primary arthritis, left hip(s)  The patient history, physical examination, clinical judgement of the provider and imaging studies are consistent with end stage degenerative joint disease of  the left hip(s) and total hip arthroplasty is deemed medically necessary. The treatment options including medical management, injection therapy, arthroscopy and arthroplasty were discussed at length. The risks and benefits of total hip arthroplasty were presented and reviewed. The risks due to aseptic loosening, infection, stiffness, dislocation/subluxation,  thromboembolic complications and other imponderables were discussed.  The patient acknowledged the explanation, agreed to proceed with the plan and consent was signed. Patient is being admitted for inpatient treatment for surgery, pain control, PT, OT, prophylactic antibiotics, VTE prophylaxis, progressive ambulation and ADL's and discharge planning.The patient is planning to be discharged home with home health services

## 2023-06-06 MED ORDER — TRANEXAMIC ACID 1000 MG/10ML IV SOLN
2000.0000 mg | INTRAVENOUS | Status: DC
  Filled 2023-06-06: qty 20

## 2023-06-07 ENCOUNTER — Ambulatory Visit (HOSPITAL_COMMUNITY): Admitting: Certified Registered"

## 2023-06-07 ENCOUNTER — Encounter (HOSPITAL_COMMUNITY): Payer: Self-pay | Admitting: Orthopaedic Surgery

## 2023-06-07 ENCOUNTER — Ambulatory Visit (HOSPITAL_COMMUNITY)
Admission: RE | Admit: 2023-06-07 | Discharge: 2023-06-07 | Disposition: A | Discharge status: Home / Self Care | Attending: Orthopaedic Surgery | Admitting: Orthopaedic Surgery

## 2023-06-07 ENCOUNTER — Encounter (HOSPITAL_COMMUNITY)
Admission: RE | Disposition: A | Discharge status: Home / Self Care | Payer: Self-pay | Source: Home / Self Care | Attending: Orthopaedic Surgery

## 2023-06-07 ENCOUNTER — Other Ambulatory Visit: Payer: Self-pay

## 2023-06-07 ENCOUNTER — Ambulatory Visit (HOSPITAL_COMMUNITY)

## 2023-06-07 ENCOUNTER — Ambulatory Visit (HOSPITAL_COMMUNITY): Payer: Self-pay | Admitting: Medical

## 2023-06-07 DIAGNOSIS — M1612 Unilateral primary osteoarthritis, left hip: Secondary | ICD-10-CM | POA: Diagnosis present

## 2023-06-07 DIAGNOSIS — Z01818 Encounter for other preprocedural examination: Secondary | ICD-10-CM

## 2023-06-07 DIAGNOSIS — F419 Anxiety disorder, unspecified: Secondary | ICD-10-CM | POA: Diagnosis not present

## 2023-06-07 DIAGNOSIS — I4891 Unspecified atrial fibrillation: Secondary | ICD-10-CM

## 2023-06-07 DIAGNOSIS — F32A Depression, unspecified: Secondary | ICD-10-CM | POA: Diagnosis not present

## 2023-06-07 DIAGNOSIS — G4733 Obstructive sleep apnea (adult) (pediatric): Secondary | ICD-10-CM | POA: Insufficient documentation

## 2023-06-07 DIAGNOSIS — E7800 Pure hypercholesterolemia, unspecified: Secondary | ICD-10-CM | POA: Diagnosis not present

## 2023-06-07 HISTORY — PX: TOTAL HIP ARTHROPLASTY: SHX124

## 2023-06-07 SURGERY — ARTHROPLASTY, HIP, TOTAL, ANTERIOR APPROACH
Anesthesia: Monitor Anesthesia Care | Site: Hip | Laterality: Left

## 2023-06-07 MED ORDER — TRANEXAMIC ACID-NACL 1000-0.7 MG/100ML-% IV SOLN
INTRAVENOUS | Status: AC
  Filled 2023-06-07: qty 100

## 2023-06-07 MED ORDER — KETOROLAC TROMETHAMINE 15 MG/ML IJ SOLN: 7.5000 mg | Freq: Four times a day (QID) | INTRAMUSCULAR | Status: DC

## 2023-06-07 MED ORDER — METOCLOPRAMIDE HCL 5 MG PO TABS: 5.0000 mg | ORAL_TABLET | Freq: Three times a day (TID) | ORAL | Status: DC | PRN

## 2023-06-07 MED ORDER — PROPOFOL 10 MG/ML IV BOLUS
INTRAVENOUS | Status: DC | PRN
  Administered 2023-06-07: 30 mg via INTRAVENOUS

## 2023-06-07 MED ORDER — METHOCARBAMOL 500 MG PO TABS
ORAL_TABLET | ORAL | Status: AC
  Filled 2023-06-07: qty 1

## 2023-06-07 MED ORDER — CEFAZOLIN SODIUM-DEXTROSE 2-4 GM/100ML-% IV SOLN
INTRAVENOUS | Status: AC
  Filled 2023-06-07: qty 100

## 2023-06-07 MED ORDER — HYDROCODONE-ACETAMINOPHEN 5-325 MG PO TABS
ORAL_TABLET | ORAL | Status: AC
  Filled 2023-06-07: qty 2

## 2023-06-07 MED ORDER — ORAL CARE MOUTH RINSE: 15.0000 mL | Freq: Once | OROMUCOSAL | Status: AC

## 2023-06-07 MED ORDER — TRANEXAMIC ACID-NACL 1000-0.7 MG/100ML-% IV SOLN
1000.0000 mg | INTRAVENOUS | Status: AC
  Administered 2023-06-07: 1000 mg via INTRAVENOUS
  Filled 2023-06-07: qty 100

## 2023-06-07 MED ORDER — ACETAMINOPHEN 500 MG PO TABS: 500.0000 mg | ORAL_TABLET | Freq: Four times a day (QID) | ORAL | Status: DC

## 2023-06-07 MED ORDER — PHENYLEPHRINE HCL-NACL 20-0.9 MG/250ML-% IV SOLN
INTRAVENOUS | Status: DC | PRN
Start: 2023-06-07 — End: 2023-06-07
  Administered 2023-06-07: 20 ug/min via INTRAVENOUS

## 2023-06-07 MED ORDER — LACTATED RINGERS IV BOLUS
250.0000 mL | Freq: Once | INTRAVENOUS | Status: AC
  Administered 2023-06-07: 250 mL via INTRAVENOUS

## 2023-06-07 MED ORDER — LACTATED RINGERS IV SOLN: INTRAVENOUS | Status: DC

## 2023-06-07 MED ORDER — BUPIVACAINE-EPINEPHRINE (PF) 0.25% -1:200000 IJ SOLN
INTRAMUSCULAR | Status: AC
  Filled 2023-06-07: qty 30

## 2023-06-07 MED ORDER — CHLORHEXIDINE GLUCONATE 0.12 % MT SOLN
15.0000 mL | Freq: Once | OROMUCOSAL | Status: AC
  Administered 2023-06-07: 15 mL via OROMUCOSAL

## 2023-06-07 MED ORDER — POVIDONE-IODINE 10 % EX SWAB: 2.0000 | Freq: Once | CUTANEOUS | Status: DC

## 2023-06-07 MED ORDER — ALBUMIN HUMAN 5 % IV SOLN
INTRAVENOUS | Status: AC
  Filled 2023-06-07: qty 250

## 2023-06-07 MED ORDER — PROPOFOL 10 MG/ML IV BOLUS
INTRAVENOUS | Status: AC
  Filled 2023-06-07: qty 20

## 2023-06-07 MED ORDER — 0.9 % SODIUM CHLORIDE (POUR BTL) OPTIME
TOPICAL | Status: DC | PRN
  Administered 2023-06-07: 1000 mL

## 2023-06-07 MED ORDER — ONDANSETRON HCL 4 MG PO TABS: 4.0000 mg | ORAL_TABLET | Freq: Four times a day (QID) | ORAL | Status: DC | PRN

## 2023-06-07 MED ORDER — BUPIVACAINE IN DEXTROSE 0.75-8.25 % IT SOLN
INTRATHECAL | Status: DC | PRN
  Administered 2023-06-07: 1.8 mL via INTRATHECAL

## 2023-06-07 MED ORDER — TRANEXAMIC ACID 1000 MG/10ML IV SOLN
INTRAVENOUS | Status: DC | PRN
  Administered 2023-06-07: 2000 mg via TOPICAL

## 2023-06-07 MED ORDER — STERILE WATER FOR IRRIGATION IR SOLN
Status: DC | PRN
  Administered 2023-06-07: 2000 mL

## 2023-06-07 MED ORDER — MORPHINE SULFATE (PF) 2 MG/ML IV SOLN: 0.5000 mg | INTRAVENOUS | Status: DC | PRN

## 2023-06-07 MED ORDER — FENTANYL CITRATE (PF) 100 MCG/2ML IJ SOLN
INTRAMUSCULAR | Status: AC
Start: 2023-06-07 — End: ?
  Filled 2023-06-07: qty 2

## 2023-06-07 MED ORDER — BUPIVACAINE-EPINEPHRINE (PF) 0.25% -1:200000 IJ SOLN
INTRAMUSCULAR | Status: DC | PRN
  Administered 2023-06-07: 30 mL

## 2023-06-07 MED ORDER — ACETAMINOPHEN 500 MG PO TABS
1000.0000 mg | ORAL_TABLET | Freq: Once | ORAL | Status: AC
  Administered 2023-06-07: 1000 mg via ORAL
  Filled 2023-06-07: qty 2

## 2023-06-07 MED ORDER — FENTANYL CITRATE (PF) 100 MCG/2ML IJ SOLN
INTRAMUSCULAR | Status: DC | PRN
Start: 2023-06-07 — End: 2023-06-07
  Administered 2023-06-07: 50 ug via INTRAVENOUS

## 2023-06-07 MED ORDER — AMISULPRIDE (ANTIEMETIC) 5 MG/2ML IV SOLN
10.0000 mg | Freq: Once | INTRAVENOUS | Status: DC | PRN
Start: 2023-06-07 — End: 2023-06-07

## 2023-06-07 MED ORDER — METHOCARBAMOL 1000 MG/10ML IJ SOLN: 500.0000 mg | Freq: Four times a day (QID) | INTRAMUSCULAR | Status: DC | PRN

## 2023-06-07 MED ORDER — OXYCODONE HCL 5 MG PO TABS
5.0000 mg | ORAL_TABLET | Freq: Once | ORAL | Status: AC | PRN
  Administered 2023-06-07: 5 mg via ORAL

## 2023-06-07 MED ORDER — EPHEDRINE 5 MG/ML INJ
INTRAVENOUS | Status: AC
Start: 2023-06-07 — End: ?
  Filled 2023-06-07: qty 5

## 2023-06-07 MED ORDER — BUPIVACAINE LIPOSOME 1.3 % IJ SUSP: 10.0000 mL | Freq: Once | INTRAMUSCULAR | Status: DC

## 2023-06-07 MED ORDER — ONDANSETRON HCL 4 MG/2ML IJ SOLN
INTRAMUSCULAR | Status: AC
  Filled 2023-06-07: qty 2

## 2023-06-07 MED ORDER — EPHEDRINE SULFATE-NACL 50-0.9 MG/10ML-% IV SOSY
PREFILLED_SYRINGE | INTRAVENOUS | Status: DC | PRN
  Administered 2023-06-07 (×5): 5 mg via INTRAVENOUS

## 2023-06-07 MED ORDER — METOCLOPRAMIDE HCL 5 MG/ML IJ SOLN: 5.0000 mg | Freq: Three times a day (TID) | INTRAMUSCULAR | Status: DC | PRN

## 2023-06-07 MED ORDER — BUPIVACAINE LIPOSOME 1.3 % IJ SUSP
INTRAMUSCULAR | Status: DC | PRN
  Administered 2023-06-07: 10 mL

## 2023-06-07 MED ORDER — ONDANSETRON HCL 4 MG/2ML IJ SOLN
INTRAMUSCULAR | Status: DC | PRN
  Administered 2023-06-07: 4 mg via INTRAVENOUS

## 2023-06-07 MED ORDER — ASPIRIN 81 MG PO TBEC: 81.0000 mg | DELAYED_RELEASE_TABLET | Freq: Two times a day (BID) | ORAL | 0 refills | Status: AC

## 2023-06-07 MED ORDER — LACTATED RINGERS IV BOLUS
500.0000 mL | Freq: Once | INTRAVENOUS | Status: AC
  Administered 2023-06-07: 500 mL via INTRAVENOUS

## 2023-06-07 MED ORDER — CEFAZOLIN SODIUM-DEXTROSE 2-4 GM/100ML-% IV SOLN
2.0000 g | INTRAVENOUS | Status: AC
  Administered 2023-06-07: 2 g via INTRAVENOUS
  Filled 2023-06-07: qty 100

## 2023-06-07 MED ORDER — ALBUMIN HUMAN 5 % IV SOLN: INTRAVENOUS | Status: DC | PRN

## 2023-06-07 MED ORDER — DEXAMETHASONE SODIUM PHOSPHATE 10 MG/ML IJ SOLN
INTRAMUSCULAR | Status: DC | PRN
  Administered 2023-06-07: 4 mg via INTRAVENOUS

## 2023-06-07 MED ORDER — HYDROCODONE-ACETAMINOPHEN 7.5-325 MG PO TABS
1.0000 | ORAL_TABLET | ORAL | Status: DC | PRN
  Administered 2023-06-07: 2 via ORAL

## 2023-06-07 MED ORDER — TRANEXAMIC ACID-NACL 1000-0.7 MG/100ML-% IV SOLN
1000.0000 mg | Freq: Once | INTRAVENOUS | Status: AC
  Administered 2023-06-07: 1000 mg via INTRAVENOUS

## 2023-06-07 MED ORDER — CEFAZOLIN SODIUM-DEXTROSE 2-4 GM/100ML-% IV SOLN
2.0000 g | Freq: Four times a day (QID) | INTRAVENOUS | Status: DC
  Administered 2023-06-07: 2 g via INTRAVENOUS

## 2023-06-07 MED ORDER — LIDOCAINE HCL (PF) 2 % IJ SOLN
INTRAMUSCULAR | Status: AC
  Filled 2023-06-07: qty 5

## 2023-06-07 MED ORDER — LIDOCAINE HCL (CARDIAC) PF 100 MG/5ML IV SOSY
PREFILLED_SYRINGE | INTRAVENOUS | Status: DC | PRN
  Administered 2023-06-07: 60 mg via INTRAVENOUS

## 2023-06-07 MED ORDER — OXYCODONE HCL 5 MG/5ML PO SOLN: 5.0000 mg | Freq: Once | ORAL | Status: AC | PRN

## 2023-06-07 MED ORDER — OXYCODONE-ACETAMINOPHEN 5-325 MG PO TABS: 1.0000 | ORAL_TABLET | Freq: Four times a day (QID) | ORAL | 0 refills | Status: AC | PRN

## 2023-06-07 MED ORDER — METHOCARBAMOL 500 MG PO TABS
500.0000 mg | ORAL_TABLET | Freq: Four times a day (QID) | ORAL | Status: DC | PRN
  Administered 2023-06-07: 500 mg via ORAL

## 2023-06-07 MED ORDER — PROPOFOL 500 MG/50ML IV EMUL
INTRAVENOUS | Status: DC | PRN
  Administered 2023-06-07: 50 ug/kg/min via INTRAVENOUS

## 2023-06-07 MED ORDER — HYDROMORPHONE HCL 1 MG/ML IJ SOLN: 0.2500 mg | INTRAMUSCULAR | Status: DC | PRN

## 2023-06-07 MED ORDER — ONDANSETRON HCL 4 MG/2ML IJ SOLN: 4.0000 mg | Freq: Four times a day (QID) | INTRAMUSCULAR | Status: DC | PRN

## 2023-06-07 MED ORDER — PROPOFOL 1000 MG/100ML IV EMUL
INTRAVENOUS | Status: AC
  Filled 2023-06-07: qty 100

## 2023-06-07 MED ORDER — HYDROCODONE-ACETAMINOPHEN 7.5-325 MG PO TABS
ORAL_TABLET | ORAL | Status: AC
  Filled 2023-06-07: qty 2

## 2023-06-07 MED ORDER — OXYCODONE HCL 5 MG PO TABS
ORAL_TABLET | ORAL | Status: AC
  Filled 2023-06-07: qty 1

## 2023-06-07 MED ORDER — DEXAMETHASONE SODIUM PHOSPHATE 10 MG/ML IJ SOLN
INTRAMUSCULAR | Status: AC
  Filled 2023-06-07: qty 1

## 2023-06-07 MED ORDER — BUPIVACAINE LIPOSOME 1.3 % IJ SUSP
INTRAMUSCULAR | Status: AC
  Filled 2023-06-07: qty 10

## 2023-06-07 SURGICAL SUPPLY — 48 items
BAG COUNTER SPONGE SURGICOUNT (BAG) IMPLANT
BAG DECANTER FOR FLEXI CONT (MISCELLANEOUS) ×1 IMPLANT
BLADE SAW SGTL 18X1.27X75 (BLADE) ×1 IMPLANT
BOOTIES KNEE HIGH SLOAN (MISCELLANEOUS) ×1 IMPLANT
CELLS DAT CNTRL 66122 CELL SVR (MISCELLANEOUS) ×1 IMPLANT
COVER PERINEAL POST (MISCELLANEOUS) ×1 IMPLANT
COVER SURGICAL LIGHT HANDLE (MISCELLANEOUS) ×1 IMPLANT
CUP ACETABULAR GRIPTON 100 52 (Orthopedic Implant) IMPLANT
DRAPE FOOT SWITCH (DRAPES) ×1 IMPLANT
DRAPE IMP U-DRAPE 54X76 (DRAPES) ×1 IMPLANT
DRAPE STERI IOBAN 125X83 (DRAPES) ×1 IMPLANT
DRAPE U-SHAPE 47X51 STRL (DRAPES) ×1 IMPLANT
DRSG AQUACEL AG ADV 3.5X 6 (GAUZE/BANDAGES/DRESSINGS) ×1 IMPLANT
DRSG AQUACEL AG ADV 3.5X10 (GAUZE/BANDAGES/DRESSINGS) IMPLANT
DURAPREP 26ML APPLICATOR (WOUND CARE) ×1 IMPLANT
ELECT BLADE TIP CTD 4 INCH (ELECTRODE) ×1 IMPLANT
ELECT PENCIL ROCKER SW 15FT (MISCELLANEOUS) ×1 IMPLANT
ELECT REM PT RETURN 15FT ADLT (MISCELLANEOUS) ×1 IMPLANT
ELIMINATOR HOLE APEX DEPUY (Hips) IMPLANT
GLOVE BIO SURGEON STRL SZ8 (GLOVE) ×2 IMPLANT
GLOVE BIOGEL PI IND STRL 7.0 (GLOVE) ×1 IMPLANT
GLOVE BIOGEL PI IND STRL 8 (GLOVE) ×2 IMPLANT
GLOVE SURG SYN 7.0 (GLOVE) ×1 IMPLANT
GLOVE SURG SYN 7.0 PF PI (GLOVE) ×1 IMPLANT
GOWN SRG XL LVL 4 BRTHBL STRL (GOWNS) ×1 IMPLANT
GOWN STRL REUS W/ TWL XL LVL3 (GOWN DISPOSABLE) ×2 IMPLANT
HEAD CERAMIC 36 PLUS5 (Hips) IMPLANT
HOLDER FOLEY CATH W/STRAP (MISCELLANEOUS) ×1 IMPLANT
KIT TURNOVER KIT A (KITS) IMPLANT
LINER ACETAB NEUTRAL 36ID 520D (Liner) IMPLANT
MANIFOLD NEPTUNE II (INSTRUMENTS) ×1 IMPLANT
NDL HYPO 22X1.5 SAFETY MO (MISCELLANEOUS) ×1 IMPLANT
NEEDLE HYPO 22X1.5 SAFETY MO (MISCELLANEOUS) ×1 IMPLANT
NS IRRIG 1000ML POUR BTL (IV SOLUTION) ×1 IMPLANT
PACK ANTERIOR HIP CUSTOM (KITS) ×1 IMPLANT
PROTECTOR NERVE ULNAR (MISCELLANEOUS) ×1 IMPLANT
RETRACTOR WND ALEXIS 18 MED (MISCELLANEOUS) ×1 IMPLANT
SPIKE FLUID TRANSFER (MISCELLANEOUS) ×1 IMPLANT
STEM FEMORAL SZ6 HIGH ACTIS (Stem) IMPLANT
STRIP CLOSURE SKIN 1/2X4 (GAUZE/BANDAGES/DRESSINGS) IMPLANT
SUT ETHIBOND NAB CT1 #1 30IN (SUTURE) ×2 IMPLANT
SUT VIC AB 1 CT1 36 (SUTURE) ×1 IMPLANT
SUT VIC AB 2-0 CT1 TAPERPNT 27 (SUTURE) ×1 IMPLANT
SUT VICRYL AB 3-0 FS1 BRD 27IN (SUTURE) ×1 IMPLANT
SUTURE STRATFX 0 PDS 27 VIOLET (SUTURE) ×1 IMPLANT
SYR 50ML LL SCALE MARK (SYRINGE) ×1 IMPLANT
TRAY FOLEY MTR SLVR 16FR STAT (SET/KITS/TRAYS/PACK) ×1 IMPLANT
YANKAUER SUCT BULB TIP NO VENT (SUCTIONS) ×1 IMPLANT

## 2023-06-07 NOTE — Op Note (Signed)
 PRE-OP DIAGNOSIS:  LEFT HIP DEGENERATIVE JOINT DISEASE POST-OP DIAGNOSIS: same PROCEDURE:  LEFT TOTAL HIP ARTHROPLASTY ANTERIOR APPROACH ANESTHESIA:  Spinal and MAC SURGEON:  Dayne Even MD ASSISTANT:  Lucrezia Sachs PA-C   INDICATIONS FOR PROCEDURE:  The patient is a 73 y.o. male with a long history of a painful hip.  This has persisted despite multiple conservative measures.  The patient has persisted with pain and dysfunction making rest and activity difficult.  A total hip replacement is offered as surgical treatment.  Informed operative consent was obtained after discussion of possible complications including reaction to anesthesia, infection, neurovascular injury, dislocation, DVT, PE, and death.  The importance of the postoperative rehab program to optimize result was stressed with the patient.  SUMMARY OF FINDINGS AND PROCEDURE:  Under the above anesthesia through a anterior approach an the Hana table a left THR was performed.  The patient had severe degenerative change and excellent bone quality.  We used DePuy components to replace the hip and these were size 6 high offset Actis femur capped with a +5 36mm ceramic hip ball.  On the acetabular side we used a size 52 Gription shell with a plus 0 neutral polyethylene liner.  We did use a hole eliminator.  Lucrezia Sachs PA-C assisted throughout and was invaluable to the completion of the case in that he helped position and retract while I performed the procedure.  He also closed simultaneously to help minimize OR time.  I used fluoroscopy throughout the case to check position of components and leg lengths and read all these views myself.  DESCRIPTION OF PROCEDURE:  The patient was taken to the OR suite where the above anesthetic was applied.  The patient was then positioned on the Hana table supine.  All bony prominences were appropriately padded.  Prep and drape was then performed in normal sterile fashion.  The patient was given kefzol  preoperative  antibiotic and an appropriate time out was performed.  We then took an anterior approach to the left hip.  Dissection was taken through adipose to the tensor fascia lata fascia.  This structure was incised longitudinally and we dissected in the intermuscular interval just medial to this muscle.  Cobra retractors were placed superior and inferior to the femoral neck superficial to the capsule.  A capsular incision was then made and the retractors were placed along the femoral neck.  Xray was brought in to get a good level for the femoral neck cut which was made with an oscillating saw and osteotome.  The femoral head was removed with a corkscrew.  The acetabulum was exposed and some labral tissues were excised. Reaming was taken to the inside wall of the pelvis and sequentially up to 1 mm smaller than the actual component.  A trial of components was done and then the aforementioned acetabular shell was placed in appropriate tilt and anteversion confirmed by fluoroscopy. The liner was placed along with the hole eliminator and attention was turned to the femur.  The leg was brought down and over into adduction and the elevator bar was used to raise the femur up gently in the wound.  The piriformis was released with care taken to preserve the obturator internus attachment and all of the posterior capsule. The femur was reamed and then broached to the appropriate size.  A trial reduction was done and the aforementioned head and neck assembly gave us  the best stability in extension with external rotation.  Leg lengths were felt to be about equal  by fluoroscopic exam.  The trial components were removed and the wound irrigated.  We then placed the femoral component in appropriate anteversion.  The head was applied to a dry stem neck and the hip again reduced.  It was again stable in the aforementioned position.  The would was irrigated again followed by re-approximation of anterior capsule with ethibond suture. Tensor  fascia was repaired with V-loc suture  followed by deep closure with #O and #2 undyed vicryl.  Skin was closed with subQ stitch and steristrips followed by a sterile dressing.  EBL and IOF can be obtained from anesthesia records.  DISPOSITION:  The patient was taken to PACU to potentially go home same day depending on ability to walk and tolerate liquids.

## 2023-06-07 NOTE — Evaluation (Signed)
 Physical Therapy Evaluation Patient Details Name: Tyrone BRANDENBURG, MD MRN: [id number] DOB: 10/08/1950 Today's Date: 06/07/2023  History of Present Illness  73 y.o. male s/p L THA, anterior approach on 06/07/2023 due to failure of conservative measures. Pt PMH includes but is not limited to:  HLD, a-fib, depressino, OA, RTCR (2008), R THA on 03/04/2020, OSA, HOH, lumbar laminectomy (1974/1977), and cervical laminectomy (1992).  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 Patient reports IND with mobility at baseline. Patient is now limited by functional impairments (see PT problem list below) and requires CGA and cues for transfers and gait with RW. Patient was able to ambulate 45 feet with RW and CGA and cues for safe walker management. Patient educated on safe sequencing for stair mobility with use of RW, fall risk prevention, pain management and goal, use of CP/ice, car transfers pt and spouse verbalized understanding of safe guarding position for people assisting with mobility. Patient instructed in exercises to facilitate ROM and circulation reviewed and HO provided. Patient will benefit from continued skilled PT interventions to address impairments and progress towards PLOF. Patient has met mobility goals at adequate level for discharge home with family support and [facility] services; will continue to follow if pt continues acute stay to progress towards Mod I goals.       If plan is discharge home, recommend the following: A little help with walking and/or transfers;A little help with bathing/dressing/bathroom;Assistance with cooking/housework;Assist for transportation;Help with stairs or ramp for entrance   Can travel by private vehicle        Equipment Recommendations None recommended by PT  Recommendations for Other Services       Functional Status Assessment Patient has had a recent decline in their functional status and demonstrates the ability to make  significant improvements in function in a reasonable and predictable amount of time.     Precautions / Restrictions Precautions Precautions: Fall      Mobility  Bed Mobility Overal bed mobility: Needs Assistance Bed Mobility: Supine to Sit     Supine to sit: Supervision, HOB elevated     General bed mobility comments: min cues    Transfers Overall transfer level: Needs assistance Equipment used: Rolling walker (2 wheels) Transfers: Sit to/from Stand Sit to Stand: Contact guard assist           General transfer comment: min cues pull to stand and pt reported slight dizziness, Bp reading inaccurate due to use of UE support at RW    Ambulation/Gait Ambulation/Gait assistance: Contact guard assist Gait Distance (Feet): 45 Feet Assistive device: Rolling walker (2 wheels) Gait Pattern/deviations: Step-to pattern, Antalgic, Trunk flexed, Decreased stance time - left Gait velocity: decreased     General Gait Details: slight trunk flexion with B UE support at RW to offload L LE in stance phase, min cues for safety , posture and rw management  Stairs Stairs: Yes Stairs assistance: Contact guard assist Stair Management: Two rails Number of Stairs: 3 General stair comments: step navigation with B handrails and min cues for safety and technique, PT verbally communicated use of RW on 2 non sequential steps to enter home with pt and spouse verbalizing return understanding  Wheelchair Mobility     Tilt Bed    Modified Rankin (Stroke Patients Only)       Balance Overall balance assessment: Needs assistance Sitting-balance support: Feet supported Sitting balance-Leahy Scale: Good     Standing  balance support: Bilateral upper extremity supported, During functional activity, Reliant on assistive device for balance Standing balance-Leahy Scale: Fair Standing balance comment: static standing no UE support                             Pertinent Vitals/Pain  Pain Assessment Pain Assessment: 0-10 Pain Score: 7  Pain Location: L hip and back Pain Descriptors / Indicators: Aching, Constant, Discomfort, Dull, Grimacing, Operative site guarding Pain Intervention(s): Limited activity within patient's tolerance, Monitored during session, Premedicated before session, Repositioned, Ice applied    Home Living Family/patient expects to be discharged to:: Private residence Living Arrangements: Spouse/significant other Available Help at Discharge: Family Type of Home: House Home Access: Stairs to enter Entrance Stairs-Rails: None Entrance Stairs-Number of Steps: 2 (non sequential)   Home Layout: Two level;Able to live on main level with bedroom/bathroom Home Equipment: Rolling Walker (2 wheels);Cane - single point      Prior Function Prior Level of Function : Independent/Modified Independent;Driving             Mobility Comments: IND no AD for all ADLs, self care tasks and IADLs       Extremity/Trunk Assessment        Lower Extremity Assessment Lower Extremity Assessment: LLE deficits/detail LLE Deficits / Details: ankle DF/PF 5/5 LLE Sensation: WNL (pt reports some abn sensation R foot)    Cervical / Trunk Assessment Cervical / Trunk Assessment: Normal  Communication   Communication Communication: No apparent difficulties    Cognition Arousal: Alert Behavior During Therapy: WFL for tasks assessed/performed   PT - Cognitive impairments: No apparent impairments                         Following commands: Intact       Cueing       General Comments      Exercises Total Joint Exercises Ankle Circles/Pumps: AROM, Both, 10 reps Quad Sets: AROM, Left, 5 reps Heel Slides: AROM, Left, 5 reps Hip ABduction/ADduction: AROM, Left, 5 reps, Standing Long Arc Quad: AROM, Left, 5 reps, Seated Knee Flexion: AROM, Left, 5 reps, Standing Standing Hip Extension: AROM, Left, 5 reps, Standing   Assessment/Plan    PT  Assessment Patient needs continued PT services  PT Problem List Decreased strength;Decreased range of motion;Decreased activity tolerance;Decreased balance;Decreased mobility;Decreased coordination;Pain       PT Treatment Interventions DME instruction;Gait training;Stair training;Functional mobility training;Therapeutic activities;Therapeutic exercise;Balance training;Neuromuscular re-education;Patient/family education;Modalities    PT Goals (Current goals can be found in the Care Plan section)  Acute Rehab PT Goals Patient Stated Goal: everything, garden, mow the law, automotive work, walk the dog PT Goal Formulation: With patient Time For Goal Achievement: 06/21/23 Potential to Achieve Goals: Good    Frequency 7X/week     Co-evaluation               AM-PAC PT "6 Clicks" Mobility  Outcome Measure Help needed turning from your back to your side while in a flat bed without using bedrails?: None Help needed moving from lying on your back to sitting on the side of a flat bed without using bedrails?: A Little Help needed moving to and from a bed to a chair (including a wheelchair)?: A Little Help needed standing up from a chair using your arms (e.g., wheelchair or bedside chair)?: A Little Help needed to walk in hospital room?: A Little Help needed climbing 3-5 steps  with a railing? : A Little 6 Click Score: 19    End of Session Equipment Utilized During Treatment: Gait belt Activity Tolerance: Patient limited by pain Patient left: in chair;with call bell/phone within reach;with family/visitor present Nurse Communication: Mobility status;Patient requests pain meds;Other (comment) (pt readiness for d/c from PT standpoint) PT Visit Diagnosis: Unsteadiness on feet (R26.81);Other abnormalities of gait and mobility (R26.89);Muscle weakness (generalized) (M62.81);Difficulty in walking, not elsewhere classified (R26.2);Pain Pain - Right/Left: Left Pain - part of body: Leg;Hip     Time: 1610-9604 PT Time Calculation (min) (ACUTE ONLY): 39 min   Charges:   PT Evaluation $PT Eval Low Complexity: 1 Low PT Treatments $Gait Training: 8-22 mins $Therapeutic Exercise: 8-22 mins PT General Charges $$ ACUTE PT VISIT: 1 Visit         Cary Clarks, PT Acute Rehab   Annalee Kiang 06/07/2023, 1:56 PM

## 2023-06-07 NOTE — Anesthesia Procedure Notes (Signed)
 Spinal  Patient location during procedure: OR Start time: 06/07/2023 7:38 AM End time: 06/07/2023 7:40 AM Reason for block: surgical anesthesia Staffing Performed: anesthesiologist  Anesthesiologist: Conard Decent, MD Performed by: Conard Decent, MD Authorized by: Conard Decent, MD   Preanesthetic Checklist Completed: patient identified, IV checked, site marked, risks and benefits discussed, surgical consent, monitors and equipment checked, pre-op evaluation and timeout performed Spinal Block Patient position: sitting Prep: DuraPrep Patient monitoring: blood pressure and continuous pulse ox Approach: midline Location: L3-4 Injection technique: single-shot Needle Needle type: Pencan  Needle gauge: 24 G Needle length: 9 cm Additional Notes Risks and benefits of neuraxial anesthesia including, but not limited to, infection, bleeding, local anesthetic toxicity, headache, hypotension, back pain, block failure, etc. were discussed with the patient. The patient expressed understanding and consented to the procedure. I confirmed that the patient has no bleeding disorders and is not taking blood thinners. I confirmed the patient's last platelet count with the nurse. Monitors were applied. A time-out was performed immediately prior to the procedure. Sterile technique was used throughout the whole procedure.   1 attempt(s)

## 2023-06-07 NOTE — Interval H&P Note (Signed)
 History and Physical Interval Note:  06/07/2023 7:23 AM  Wynelle Heather, MD  has presented today for surgery, with the diagnosis of LEFT HIP DEGENERATIVE JOINT DISEASE.  The various methods of treatment have been discussed with the patient and family. After consideration of risks, benefits and other options for treatment, the patient has consented to  Procedure(s) with comments: ARTHROPLASTY, HIP, TOTAL, ANTERIOR APPROACH (Left) - LEFT ANTERIOR TOTAL HIP ARTHROPLASTY as a surgical intervention.  The patient's history has been reviewed, patient examined, no change in status, stable for surgery.  I have reviewed the patient's chart and labs.  Questions were answered to the patient's satisfaction.     Doninique Lwin G Loretto Belinsky

## 2023-06-07 NOTE — Anesthesia Postprocedure Evaluation (Signed)
 Anesthesia Post Note  Patient: Tyrone Heather, MD  Procedure(s) Performed: ARTHROPLASTY, HIP, TOTAL, ANTERIOR APPROACH (Left: Hip)     Patient location during evaluation: PACU Anesthesia Type: MAC and Spinal Level of consciousness: awake Pain management: pain level controlled Vital Signs Assessment: post-procedure vital signs reviewed and stable Respiratory status: spontaneous breathing, respiratory function stable and nonlabored ventilation Cardiovascular status: blood pressure returned to baseline and stable Postop Assessment: no headache, no backache and no apparent nausea or vomiting Anesthetic complications: no   No notable events documented.  Last Vitals:  Vitals:   06/07/23 1155 06/07/23 1200  BP: 129/69 110/65  Pulse: 81 78  Resp:    Temp:    SpO2: 99% 99%    Last Pain:  Vitals:   06/07/23 1155  TempSrc:   PainSc: 0-No pain                 Conard Decent

## 2023-06-07 NOTE — Anesthesia Procedure Notes (Signed)
 Procedure Name: MAC Date/Time: 06/07/2023 7:37 AM  Performed by: Alwyn Juba, CRNAPre-anesthesia Checklist: Patient identified, Emergency Drugs available, Suction available, Patient being monitored and Timeout performed Oxygen Delivery Method: Simple face mask Placement Confirmation: positive ETCO2

## 2023-06-07 NOTE — Transfer of Care (Signed)
 Immediate Anesthesia Transfer of Care Note  Patient: Tyrone Heather, MD  Procedure(s) Performed: ARTHROPLASTY, HIP, TOTAL, ANTERIOR APPROACH (Left: Hip)  Patient Location: PACU  Anesthesia Type:Spinal  Level of Consciousness: awake, alert , and patient cooperative  Airway & Oxygen Therapy: Patient Spontanous Breathing and Patient connected to face mask oxygen  Post-op Assessment: Report given to RN and Post -op Vital signs reviewed and stable  Post vital signs: Reviewed and stable  Last Vitals:  Vitals Value Taken Time  BP 102/63 06/07/23 0920  Temp    Pulse 77 06/07/23 0922  Resp 16 06/07/23 0922  SpO2 100 % 06/07/23 0922  Vitals shown include unfiled device data.  Last Pain:  Vitals:   06/07/23 0539  TempSrc: Oral         Complications: No notable events documented.

## 2023-06-08 ENCOUNTER — Encounter (HOSPITAL_COMMUNITY): Payer: Self-pay | Admitting: Orthopaedic Surgery

## 2023-07-26 ENCOUNTER — Other Ambulatory Visit: Payer: Self-pay | Admitting: Urology

## 2023-07-26 DIAGNOSIS — R972 Elevated prostate specific antigen [PSA]: Secondary | ICD-10-CM

## 2023-08-08 ENCOUNTER — Ambulatory Visit (HOSPITAL_COMMUNITY)
Admission: RE | Admit: 2023-08-08 | Discharge: 2023-08-08 | Disposition: A | Discharge status: Home / Self Care | Source: Ambulatory Visit | Attending: Urology | Admitting: Urology

## 2023-08-08 ENCOUNTER — Other Ambulatory Visit

## 2023-08-08 DIAGNOSIS — R972 Elevated prostate specific antigen [PSA]: Secondary | ICD-10-CM | POA: Diagnosis present

## 2023-08-08 MED ORDER — GADOBUTROL 1 MMOL/ML IV SOLN
9.0000 mL | Freq: Once | INTRAVENOUS | Status: AC | PRN
  Administered 2023-08-08: 9 mL via INTRAVENOUS
